# Patient Record
Sex: Female | Born: 1970 | Race: White | Hispanic: No | Smoking: Never smoker
Health system: Southern US, Community
[De-identification: ages and names within clinical notes are randomized; demographics above are authoritative.]

## PROBLEM LIST (undated history)

## (undated) DIAGNOSIS — F329 Major depressive disorder, single episode, unspecified: Secondary | ICD-10-CM

## (undated) DIAGNOSIS — D649 Anemia, unspecified: Secondary | ICD-10-CM

## (undated) DIAGNOSIS — T7840XA Allergy, unspecified, initial encounter: Secondary | ICD-10-CM

## (undated) DIAGNOSIS — R519 Headache, unspecified: Secondary | ICD-10-CM

## (undated) DIAGNOSIS — R87619 Unspecified abnormal cytological findings in specimens from cervix uteri: Secondary | ICD-10-CM

## (undated) DIAGNOSIS — F419 Anxiety disorder, unspecified: Secondary | ICD-10-CM

## (undated) DIAGNOSIS — J45909 Unspecified asthma, uncomplicated: Secondary | ICD-10-CM

## (undated) DIAGNOSIS — F410 Panic disorder [episodic paroxysmal anxiety] without agoraphobia: Secondary | ICD-10-CM

## (undated) DIAGNOSIS — F32A Depression, unspecified: Secondary | ICD-10-CM

## (undated) DIAGNOSIS — G4730 Sleep apnea, unspecified: Secondary | ICD-10-CM

## (undated) DIAGNOSIS — B977 Papillomavirus as the cause of diseases classified elsewhere: Secondary | ICD-10-CM

## (undated) DIAGNOSIS — R51 Headache: Secondary | ICD-10-CM

## (undated) DIAGNOSIS — R112 Nausea with vomiting, unspecified: Secondary | ICD-10-CM

## (undated) DIAGNOSIS — Z9889 Other specified postprocedural states: Secondary | ICD-10-CM

## (undated) HISTORY — DX: Allergy, unspecified, initial encounter: T78.40XA

## (undated) HISTORY — DX: Unspecified abnormal cytological findings in specimens from cervix uteri: R87.619

## (undated) HISTORY — DX: Major depressive disorder, single episode, unspecified: F32.9

## (undated) HISTORY — DX: Anxiety disorder, unspecified: F41.9

## (undated) HISTORY — PX: TONSILLECTOMY: SUR1361

## (undated) HISTORY — DX: Depression, unspecified: F32.A

## (undated) HISTORY — PX: TUBAL LIGATION: SHX77

## (undated) HISTORY — PX: EYE SURGERY: SHX253

## (undated) HISTORY — DX: Panic disorder (episodic paroxysmal anxiety): F41.0

## (undated) HISTORY — DX: Unspecified asthma, uncomplicated: J45.909

## (undated) HISTORY — PX: OTHER SURGICAL HISTORY: SHX169

## (undated) HISTORY — PX: KNEE SURGERY: SHX244

## (undated) HISTORY — DX: Papillomavirus as the cause of diseases classified elsewhere: B97.7

---

## 2002-12-25 ENCOUNTER — Encounter: Admission: RE | Admit: 2002-12-25 | Discharge: 2003-02-25 | Payer: Self-pay

## 2003-08-28 ENCOUNTER — Other Ambulatory Visit: Admission: RE | Admit: 2003-08-28 | Discharge: 2003-08-28 | Payer: Self-pay | Admitting: Obstetrics and Gynecology

## 2004-06-06 ENCOUNTER — Inpatient Hospital Stay (HOSPITAL_COMMUNITY): Admission: AD | Admit: 2004-06-06 | Discharge: 2004-06-08 | Payer: Self-pay | Admitting: Obstetrics and Gynecology

## 2004-07-12 ENCOUNTER — Encounter: Admission: RE | Admit: 2004-07-12 | Discharge: 2004-08-01 | Payer: Self-pay | Admitting: Obstetrics and Gynecology

## 2004-07-18 ENCOUNTER — Other Ambulatory Visit: Admission: RE | Admit: 2004-07-18 | Discharge: 2004-07-18 | Payer: Self-pay | Admitting: Obstetrics and Gynecology

## 2005-01-10 ENCOUNTER — Ambulatory Visit (HOSPITAL_COMMUNITY): Admission: RE | Admit: 2005-01-10 | Discharge: 2005-01-10 | Payer: Self-pay | Admitting: Obstetrics and Gynecology

## 2005-01-10 ENCOUNTER — Encounter (INDEPENDENT_AMBULATORY_CARE_PROVIDER_SITE_OTHER): Payer: Self-pay | Admitting: Specialist

## 2005-01-10 HISTORY — PX: OTHER SURGICAL HISTORY: SHX169

## 2005-10-26 ENCOUNTER — Other Ambulatory Visit: Admission: RE | Admit: 2005-10-26 | Discharge: 2005-10-26 | Payer: Self-pay | Admitting: Obstetrics and Gynecology

## 2007-02-25 ENCOUNTER — Inpatient Hospital Stay (HOSPITAL_COMMUNITY): Admission: AD | Admit: 2007-02-25 | Discharge: 2007-02-25 | Payer: Self-pay | Admitting: Obstetrics and Gynecology

## 2007-03-12 ENCOUNTER — Inpatient Hospital Stay (HOSPITAL_COMMUNITY): Admission: AD | Admit: 2007-03-12 | Discharge: 2007-03-15 | Payer: Self-pay | Admitting: Obstetrics and Gynecology

## 2009-09-10 ENCOUNTER — Ambulatory Visit (HOSPITAL_COMMUNITY): Admission: RE | Admit: 2009-09-10 | Discharge: 2009-09-10 | Payer: Self-pay | Admitting: Obstetrics and Gynecology

## 2009-09-10 HISTORY — PX: DILATION AND CURETTAGE OF UTERUS: SHX78

## 2009-09-10 HISTORY — PX: ABLATION: SHX5711

## 2009-11-16 ENCOUNTER — Emergency Department (HOSPITAL_COMMUNITY): Admission: EM | Admit: 2009-11-16 | Discharge: 2009-11-16 | Payer: Self-pay | Admitting: Emergency Medicine

## 2010-09-18 LAB — CBC
HCT: 36.5 % (ref 36.0–46.0)
Hemoglobin: 11.8 g/dL — ABNORMAL LOW (ref 12.0–15.0)
MCHC: 32.4 g/dL (ref 30.0–36.0)
MCV: 78.8 fL (ref 78.0–100.0)
RBC: 4.64 MIL/uL (ref 3.87–5.11)
RDW: 13.9 % (ref 11.5–15.5)

## 2010-11-11 NOTE — Op Note (Signed)
Christy Adams, Christy Adams              ACCOUNT NO.:  [id number]   MEDICAL RECORD NO.:  [id number]          PATIENT TYPE:  AMB   LOCATION:  SDC                           [facility]  [facility]   PHYSICIAN:  Huel Cote, M.D. DATE OF BIRTH:  04-Jul-1970   DATE OF PROCEDURE:  01/10/2005  DATE OF DISCHARGE:                                 OPERATIVE REPORT   PREOPERATIVE DIAGNOSIS:  1.  Vaginal lesion.  2.  Dyspareunia.   POSTOPERATIVE DIAGNOSIS:  1.  Vaginal lesion.  2.  Dyspareunia.  3.  With mole on right thigh as well.   PROCEDURE:  Excision of vaginal lesion from left fornix and mole excision.   SURGEON:  Dr. Huel Cote.   ANESTHESIA:  Spinal.   SPECIMEN:  Mole and vaginal lesion.   ESTIMATED BLOOD LOSS:  100 mL.   COMPLICATIONS:  None.   FINDINGS:  There was a vaginal lesion noted in the left fornix at 4 o'clock  on the edge of cervix. It was slightly nodular and white in appearance. It  was not obviously cystic. The patient had requested a mole removal from her  right thigh which lay just adjacent to the panty line and had grown some in  the past several months. This was performed per her request.   PROCEDURE:  The patient was taken to the operating room where spinal  anesthesia was obtained and found be adequate by testing. Local anesthetic  was injected around a small mole on the right hip area. This was then  removed in entirety in an elliptical incision and the remaining incision  closed with three sutures of interrupted fashion 3-0 Vicryl. The speculum  was then placed in the vagina and the cervix was identified and grasped with  the ring forceps and pulled to the patient's right. In the left fornix there  was then visible a small nodular lesion at approximately 4 o'clock on the  edge of the cervix. This was easily palpable on exam and was the source of  the patient's tenderness with intercourse and had been identified on  previous office exam with full sensation.  This small nodule was grasped with  an Allis clamp and the Mayo scissors utilized to excise just around and  under the nodule removing it in its entirety. The underlying vaginal mucosa  was then closed with a running fashion of 2-0 Vicryl and good hemostasis  noted. At the conclusion of the procedure there was no active bleeding  noted. The fornix was again palpated and there was no residual nodule felt.  Unclear whether this represents scar tissue, granulation tissue or some  other  pathology therefore it was sent to pathology. The speculum was then removed  from the patient's vagina as well as all instruments, sponges which were  counted and found to be correct and the patient was taken to the recovery  room in stable condition.       KR/MEDQ  D:  01/10/2005  T:  01/10/2005  Job:  [id number]

## 2010-11-11 NOTE — Discharge Summary (Signed)
NAMEAISLING, Adams              ACCOUNT NO.:  [id number]   MEDICAL RECORD NO.:  [id number]          PATIENT TYPE:  INP   LOCATION:  9119                          [facility]:  [facility]   PHYSICIAN:  Huel Cote, M.D. DATE OF BIRTH:  09-23-1970   DATE OF ADMISSION:  06/06/2004  DATE OF DISCHARGE:  06/08/2004                                 DISCHARGE SUMMARY   DISCHARGE DIAGNOSES:  1.  Term pregnancy at 40+ weeks.  2.  Status post normal spontaneous vaginal delivery.  3.  Positive group B strep status.   DISCHARGE MEDICATIONS:  1.  Motrin 600 mg p.o. q.6 h.  2.  Percocet 1-2 tablets p.o. q.4 h. p.r.n.   DISCHARGE FOLLOWUP:  The patient is to followup with my office in 6 weeks  for her routine postpartum exam.   HOSPITAL COURSE:  The patient is a 40 year old G2, P1-0-0-1 who was admitted  at 40+ weeks for induction of labor given favorable cervix and past her due  date.  Patient's prenatal care was uncomplicated except for positive group B  strep status which will be treated in labor and also history of depression  which was very stable on Zoloft throughout her pregnancy.   PAST OB HISTORY:  Significant for one spontaneous vaginal delivery in 2003  of an 8 pound 12 ounce infant.   PRENATAL LABORATORIES:  A positive, rubella immune, hepatitis B surface  antigen negative, HIV negative, RPR negative, GC negative, Chlamydia  negative, group B strep positive, triple screen normal, and one hour Glucola  normal.   She had no known drug allergies.   She is taking Zoloft 50 mg p.o. q.h.s. on admission.   PAST MEDICAL HISTORY:  Significant only for the extreme depression which was  stable on medications and mild mitral valve prolapse which did not require  antibiotic therapy.   PAST SURGICAL HISTORY:  Left knee arthroscopy in the top right groin.   PAST GYN HISTORY:  No abnormal Pap smears.   On admission she was afebrile with stable vital signs.  Fetal heart rate was  reactive.   Cardiac exam was regular rate and rhythm.  Lungs were clear.  Abdomen was soft and nontender.  On pelvic exam, cervix was 15% effaced, 1-2  cm and -2 station.  She had a rupture of membranes with clear fluid obtained  and was placed on penicillin for her group B strep positive status upon  arrival.  She progressed well throughout the day.  Reached complete dilation  and pushed with a normal spontaneous vaginal delivery of a vigorous female  infant over small second-degree lacerations.  Nuchal cord x 1 reduced over  head.  Apgars were 7 and 9.  Weight was 8 pounds 2 ounces.  Placenta was  delivered spontaneously with a very long cord noted.  A small second-degree  laceration was repaired with 2-0 Vicryl.  Cervix, rectum, and vagina were  all intact.  She was then admitted for routine postpartum care and did very  well and postpartum day #2 she was having no significant complaints.  Her  lochia was normal.  She was afebrile with normal vital signs and her fundus  firm.  She had a postpartum hemoglobin of 9.4, down from 11.2 and was felt  stable for discharge home.     Kath   KR/MEDQ  D:  06/08/2004  T:  06/08/2004  Job:  [id number]

## 2010-11-11 NOTE — H&P (Signed)
Christy Adams, Christy Adams              ACCOUNT NO.:  [id number]   MEDICAL RECORD NO.:  [id number]          PATIENT TYPE:  AMB   LOCATION:  SDC                           [facility]   PHYSICIAN:  Huel Cote, M.D. DATE OF BIRTH:  08-May-1971   DATE OF ADMISSION:  01/10/2005  DATE OF DISCHARGE:                                HISTORY & PHYSICAL   The patient is a 40 year old G2, P2 who comes in for excision of a vaginal  nodule which is located in the left fornix of the vagina. This was first  noted after her most recent vaginal delivery and is somewhat tender and  causing difficulty with painful intercourse. For this reason we have decided  to elect removal.   PAST OBSTETRICAL HISTORY:  Significant for two vaginal deliveries,  uneventful.   PAST MEDICAL HISTORY:  1.  Depression.  2.  Mitral valve prolapse which does not require antibiotic therapy.   PAST SURGICAL HISTORY:  Left knee arthroscopy.   PAST GYN HISTORY:  No abnormal Pap smears.   CURRENT MEDICATIONS:  Micronor, birth control pills.   ALLERGIES:  No known drug allergies.   PHYSICAL EXAMINATION:  VITAL SIGNS:  Weight is approximately 160 pounds.  Blood pressure 100/70.  CARDIAC EXAM:  Regular rate and rhythm.  LUNGS:  Clear.  ABDOMEN:  soft, nontender.  PELVIC EXAM:  Normal genitalia noted. Cervix has no lesions. There is a  lesion palpable in the left fornix which could possibly be a cyst versus  scar tissue. This is tender to palpation. Uterus is normal and adnexa have  no masses. The patient has a persistent vaginal cyst which was first noted  in January of 2006 and has not resolved. It is tender and is causing some  pain with intercourse. Therefore we have elected to attempt surgical  resection.   The patient was counseled of the risks and benefits of the procedure  including bleeding, infection, and possible scarring in the vagina. She  understands and desires to proceed with the procedure as  stated.       KR/MEDQ  D:  01/01/2005  T:  01/01/2005  Job:  [id number]

## 2010-11-11 NOTE — Discharge Summary (Signed)
Christy Adams, Christy Adams              ACCOUNT NO.:  [id number]   MEDICAL RECORD NO.:  [id number]          PATIENT TYPE:  INP   LOCATION:  9112                          [facility]   PHYSICIAN:  Huel Cote, M.D. DATE OF BIRTH:  [birth year]   DATE OF ADMISSION:  03/12/2007  DATE OF DISCHARGE:  03/15/2007                               DISCHARGE SUMMARY   DISCHARGE DIAGNOSES:  1. Term pregnancy at 39-6/7 weeks delivered.  2. Variable lie of fetus with induction of labor while vertex.  3. Status post normal spontaneous vaginal delivery.   DISCHARGE MEDICATIONS:  1. Motrin 600 mg p.o. every 6 hours p.r.n.  2. Percocet 1-2 tablets p.o.  every 4 hours p.r.n.   DISCHARGE FOLLOWUP:  The patient is to follow up in the office in 6  weeks for her routine postpartum exam.   HOSPITAL COURSE:  The patient is 40 year old G3, P 2-0-0-2 who was  admitted at 39-6/[redacted] weeks gestation to labor and delivery for induction  of labor given a variable lie of the fetus rotating from vertex to  breech to vertex over the last 2 weeks.  This patient had been counseled  and the desire was to proceed with induction of labor while the vertex  was presenting.  She had been examined in the office and was found to be  51 and -3, but vertex and though was not extremely favorable, wished to  try a Pitocin induction to avoid C-section.  Prenatal care had been  otherwise uneventful except for some depression which was stable on  Zoloft.   PRENATAL LABS ARE AS FOLLOWS:  A+ antibody negative, RPR nonreactive,  rubella immune, hepatitis B surface antigen negative, HIV negative, GC  negative, chlamydia negative, group B strep positive, 1-hour Glucola 116  and first trimester screen normal.   PAST OBSTETRICAL HISTORY:  She had two prior vaginal deliveries in 2003,  8 pounds, 12 ounces, in 2005, 8 pounds, 2 ounces.   PAST GYN HISTORY:  No abnormal Pap smears.   PAST MEDICAL HISTORY:  Mild mitral valve prolapse and a  mild history of  hypothyroidism.   PAST SURGICAL HISTORY:  In 1991 she had a meniscus tear of her left  knee.  In 1982 she had a tonsillectomy.   ALLERGIES:  NO KNOWN DRUG ALLERGIES.   MEDICATIONS:  Included Zoloft 50 mg p.o. daily.   On admission she was afebrile with stable vital signs.  Fetal heart rate  was reactive.  Cervix was long one and high and no presenting part was  palpable when the patient arrived.  Ultrasound confirmed that the baby  was in a transverse lie and the patient was instructed to ambulate for a  short time.  After this was accomplished the baby was rechecked and was  found to be vertex and therefore Pitocin was begun.  She was counseled  carefully as a possibility with this unstable lie that if the baby did  not remain vertex she might require C-section and she was agreeable to  this plan.  She stayed on Pitocin for much of the night and began  to be  uncomfortable after several hours.  She did have spontaneous rupture of  membranes approximately 12 hours into the induction.  At that point she  was 1 cm.  Vertex was applied and she received an epidural secondary to  her pain and fatigue.  Shortly thereafter she was reexamined and found  to be 50 and 3 and -2 station with the vertex definitely well applied.  There was small forebag which was ruptured with clear fluid noted.  She  had an IUPC and FSE place applied to help with Pitocin adjustment and  the Pitocin was held briefly while the baseline was slightly low at 115  and then restarted when the baseline improved back to 130.  She then  continued to labor throughout the day and reached complete dilation and  in the evening time pushed well and delivered a viable female infant  with Apgars of 08/09, weight was 8 pounds, 14 ounces.  Placenta  delivered intact.  She had a small second-degree laceration which was  repaired in the usual fashion with 3-0 Vicryl and estimated blood loss  500 mL.  She did well  postpartum.  The baby did have a possibility of  dislocation of the hips and therefore was to see the orthopaedic doctor  shortly after delivery.  By postpartum day #2 she felt well.  She was  having minimal pain.  She was afebrile with stable vital signs.  She was  taking her Motrin, Percocet without difficulty and was felt stable for  discharge home.      Huel Cote, M.D.  Electronically Signed     KR/MEDQ  D:  04/10/2007  T:  04/11/2007  Job:  [id number]

## 2011-04-06 LAB — CBC
HCT: 26.5 — ABNORMAL LOW
MCHC: 33.3
MCV: 71.7 — ABNORMAL LOW
MCV: 72 — ABNORMAL LOW
Platelets: 285
Platelets: 340
RDW: 14.2 — ABNORMAL HIGH
RDW: 14.7 — ABNORMAL HIGH
WBC: 10.5
WBC: 14.6 — ABNORMAL HIGH

## 2011-11-23 ENCOUNTER — Ambulatory Visit (INDEPENDENT_AMBULATORY_CARE_PROVIDER_SITE_OTHER): Payer: BC Managed Care – PPO | Admitting: Family Medicine

## 2011-11-23 VITALS — BP 109/70 | HR 64 | Temp 98.5°F | Resp 16 | Ht 66.75 in | Wt 204.4 lb

## 2011-11-23 DIAGNOSIS — L259 Unspecified contact dermatitis, unspecified cause: Secondary | ICD-10-CM

## 2011-11-23 DIAGNOSIS — F329 Major depressive disorder, single episode, unspecified: Secondary | ICD-10-CM

## 2011-11-23 DIAGNOSIS — F32A Depression, unspecified: Secondary | ICD-10-CM

## 2011-11-23 MED ORDER — METHYLPREDNISOLONE ACETATE 80 MG/ML IJ SUSP
80.0000 mg | Freq: Once | INTRAMUSCULAR | Status: AC
  Administered 2011-11-23: 80 mg via INTRAMUSCULAR

## 2011-11-23 MED ORDER — SERTRALINE HCL 100 MG PO TABS: 150.0000 mg | ORAL_TABLET | Freq: Every day | ORAL | Status: DC

## 2011-11-23 NOTE — Progress Notes (Signed)
  Subjective:    Patient ID: Christy Adams, female    DOB: October 19, 1970, 41 y.o.   MRN: [id number]  HPI 41 yo female here with rash on her left arm.  Intensely itchy.  Seems to be spreading up her arm.  Started while in [place] at [employer].  Doesn't remember being directly exposed to poison ivy but does have a dog.  Does not remember any prodrome of pain, burn, or itch prior to rash showing up.  Feels well otherwise. Has been using daughter's betamethasone cream -  Not helping.  Ice makes it feel better.   Also, needs zoloft refilled. Doing well on it.  Definitely notices when she forgets to take it.    Review of Systems Negative except as per HPI     Objective:   Physical Exam  Constitutional: She appears well-developed.  Pulmonary/Chest: Effort normal.  Neurological: She is alert.  Skin:       Erythematous (pink to salmon) patches with grouped vesicles along left forearm, elbow, upper arm.           Assessment & Plan:  RAsh - given color and intense itch and absence or prodrome favor poison ivy over shingles.  However, shingles also possible given distribution. Try depomedrol.  If no improvement, increases suspicion for shingles and advised patient to call to get Rx for acyclovir.   Depression - zoloft refilled for 6 months.

## 2012-01-17 ENCOUNTER — Telehealth: Payer: Self-pay

## 2012-01-17 MED ORDER — SERTRALINE HCL 100 MG PO TABS: 150.0000 mg | ORAL_TABLET | Freq: Every day | ORAL | Status: DC

## 2012-01-17 NOTE — Telephone Encounter (Signed)
Yes you can call in 1 months supply

## 2012-01-17 NOTE — Telephone Encounter (Signed)
rx sent into pharmacy and lmom notifying pt.

## 2012-01-17 NOTE — Telephone Encounter (Signed)
[place] with only on zoloft left and is needing to see if dr can call in a few until she returns Sunday to [facility] [phone].

## 2012-03-27 ENCOUNTER — Encounter: Payer: BC Managed Care – PPO | Admitting: Physician Assistant

## 2012-04-08 ENCOUNTER — Ambulatory Visit (INDEPENDENT_AMBULATORY_CARE_PROVIDER_SITE_OTHER): Payer: BC Managed Care – PPO | Admitting: Emergency Medicine

## 2012-04-08 VITALS — BP 118/64 | HR 86 | Temp 97.8°F | Resp 16 | Ht 66.5 in | Wt 197.0 lb

## 2012-04-08 DIAGNOSIS — F329 Major depressive disorder, single episode, unspecified: Secondary | ICD-10-CM

## 2012-04-08 DIAGNOSIS — F32A Depression, unspecified: Secondary | ICD-10-CM

## 2012-04-08 MED ORDER — SERTRALINE HCL 100 MG PO TABS: 150.0000 mg | ORAL_TABLET | Freq: Every day | ORAL | Status: DC

## 2012-04-08 NOTE — Progress Notes (Signed)
[facility] [address] [place] [address] [phone]  Date:  04/08/2012   Name:  Christy Adams   DOB:  Jan 12, 1971   MRN:  [id number]  PCP:  No primary provider on file.    Chief Complaint: Medication Refill   History of Present Illness:  Christy Adams is a 41 y.o. very pleasant female patient who presents with the following:  Long history of depression under treatment with zoloft and has run out of her medication.  She has no suicidal thoughts or thoughts of self harm or harm to others.    There is no problem list on file for this patient.   No past medical history on file.  No past surgical history on file.  History  Substance Use Topics  . Smoking status: Never Smoker   . Smokeless tobacco: Not on file  . Alcohol Use: Not on file    No family history on file.  No Known Allergies  Medication list has been reviewed and updated.  Current Outpatient Prescriptions on File Prior to Visit  Medication Sig Dispense Refill  . albuterol (PROVENTIL HFA;VENTOLIN HFA) 108 (90 BASE) MCG/ACT inhaler Inhale 2 puffs into the lungs every 6 (six) hours as needed.      . sertraline (ZOLOFT) 100 MG tablet Take 1.5 tablets (150 mg total) by mouth daily.  60 tablet  0    Review of Systems:  As per HPI, otherwise negative.    Physical Examination: Filed Vitals:   04/08/12 0842  BP: 118/64  Pulse: 86  Temp: 97.8 F (36.6 C)  Resp: 16   Filed Vitals:   04/08/12 0842  Height: 5' 6.5" (1.689 m)  Weight: 197 lb (89.359 kg)   Body mass index is 31.32 kg/(m^2). Ideal Body Weight: Weight in (lb) to have BMI = 25: 156.9    GEN: WDWN, NAD, Non-toxic, Alert & Oriented x 3 HEENT: Atraumatic, Normocephalic.  Ears and Nose: No external deformity. EXTR: No clubbing/cyanosis/edema NEURO: Normal gait.  PSYCH: Normally interactive. Conversant. Not depressed or anxious appearing.  Calm demeanor.    Assessment and Plan: Depression Refill  medication Meds ordered this encounter  Medications  . albuterol (PROVENTIL HFA;VENTOLIN HFA) 108 (90 BASE) MCG/ACT inhaler    Sig: Inhale 2 puffs into the lungs every 6 (six) hours as needed.  . sertraline (ZOLOFT) 100 MG tablet    Sig: Take 1.5 tablets (150 mg total) by mouth daily.    Dispense:  60 tablet    Refill:  5   I have reviewed and agree with documentation. Robert P. Merla Riches, M.D.  Carmelina Dane, MD

## 2012-04-09 DIAGNOSIS — F329 Major depressive disorder, single episode, unspecified: Secondary | ICD-10-CM | POA: Insufficient documentation

## 2012-04-09 DIAGNOSIS — F32A Depression, unspecified: Secondary | ICD-10-CM | POA: Insufficient documentation

## 2012-04-17 ENCOUNTER — Other Ambulatory Visit: Payer: Self-pay | Admitting: Family Medicine

## 2012-04-23 ENCOUNTER — Encounter: Payer: Self-pay | Admitting: Family Medicine

## 2012-04-23 ENCOUNTER — Ambulatory Visit (INDEPENDENT_AMBULATORY_CARE_PROVIDER_SITE_OTHER): Payer: BC Managed Care – PPO | Admitting: Family Medicine

## 2012-04-23 ENCOUNTER — Ambulatory Visit: Payer: BC Managed Care – PPO

## 2012-04-23 VITALS — BP 119/76 | HR 95 | Temp 98.8°F | Resp 22 | Ht 66.0 in | Wt 192.0 lb

## 2012-04-23 DIAGNOSIS — J45909 Unspecified asthma, uncomplicated: Secondary | ICD-10-CM

## 2012-04-23 DIAGNOSIS — J069 Acute upper respiratory infection, unspecified: Secondary | ICD-10-CM

## 2012-04-23 MED ORDER — AZITHROMYCIN 250 MG PO TABS: ORAL_TABLET | ORAL | Status: DC

## 2012-04-23 MED ORDER — ALBUTEROL SULFATE (2.5 MG/3ML) 0.083% IN NEBU
2.5000 mg | INHALATION_SOLUTION | Freq: Once | RESPIRATORY_TRACT | Status: AC
  Administered 2012-04-23: 2.5 mg via RESPIRATORY_TRACT

## 2012-04-23 MED ORDER — PREDNISONE 50 MG PO TABS: ORAL_TABLET | ORAL | Status: DC

## 2012-04-23 MED ORDER — IPRATROPIUM BROMIDE 0.02 % IN SOLN
0.5000 mg | Freq: Once | RESPIRATORY_TRACT | Status: AC
  Administered 2012-04-23: 0.5 mg via RESPIRATORY_TRACT

## 2012-04-23 MED ORDER — METHYLPREDNISOLONE SODIUM SUCC 125 MG IJ SOLR
125.0000 mg | Freq: Once | INTRAMUSCULAR | Status: AC
  Administered 2012-04-23: 125 mg via INTRAMUSCULAR

## 2012-04-23 NOTE — Progress Notes (Signed)
  Subjective:    Patient ID: Christy Adams, female    DOB: [birth year], 41 y.o.   MRN: [id number]  HPI URI Symptoms Onset: 10 days  Description: rhinorrhea, nasal congestion, cough, wheezing.  Modifying factors:  Baseline hx/o asthma. Feels like she is having a flare   Symptoms Nasal discharge: yes Fever: no Sore throat: no Cough: yes Wheezing: yes Ear pain: no GI symptoms: no Sick contacts: yes  Red Flags  Stiff neck: no Dyspnea: yes Rash: no Swallowing difficulty: no  Sinusitis Risk Factors Headache/face pain: no Double sickening: no tooth pain: no  Allergy Risk Factors Sneezing: no Itchy scratchy throat: no Seasonal symptoms: no  Flu Risk Factors Headache: no muscle aches: no severe fatigue: no     Review of Systems See HPI, otherwise 12 point ROS negative     Objective:   Physical Exam  Constitutional: She appears well-developed. She appears distressed.       + increased WOB Able to speak in near full sentences    HENT:  Head: Normocephalic and atraumatic.  Right Ear: External ear normal.  Left Ear: External ear normal.       +nasal erythema, rhinorrhea bilaterally, + post oropharyngeal erythema    Eyes: Conjunctivae normal are normal. Pupils are equal, round, and reactive to light.  Neck: Normal range of motion. Neck supple.  Cardiovascular: Normal rate, regular rhythm and normal heart sounds.   Pulmonary/Chest: Effort normal. She has wheezes.  Abdominal: Soft.  Musculoskeletal: Normal range of motion.  Lymphadenopathy:    She has no cervical adenopathy.  Neurological: She is alert.  Skin: Skin is warm.     [facility] reading (PRIMARY) by  Dr. Alvester Morin. CXR preliminarily negative for any focal infiltrate. Mild perihilar bronchitic changes       Assessment & Plan:  URI induced asthma exacerbation.  S/p duoneb and solumedrol 125 mg IM x1.  Overall improvement in sxs s/p treatment.  No noted focal infiltrate on CXR.  Will place on course of  prednisone.  Zpak for atypical coverage.  Discussed infectious red flags.  Follow up as needed.     The patient and/or caregiver has been counseled thoroughly with regard to treatment plan and/or medications prescribed including dosage, schedule, interactions, rationale for use, and possible side effects and they verbalize understanding. Diagnoses and expected course of recovery discussed and will return if not improved as expected or if the condition worsens. Patient and/or caregiver verbalized understanding.

## 2012-07-29 ENCOUNTER — Ambulatory Visit (INDEPENDENT_AMBULATORY_CARE_PROVIDER_SITE_OTHER): Payer: BC Managed Care – PPO | Admitting: Emergency Medicine

## 2012-07-29 VITALS — BP 122/80 | HR 80 | Temp 98.7°F | Resp 18 | Ht 66.0 in | Wt 194.0 lb

## 2012-07-29 DIAGNOSIS — J018 Other acute sinusitis: Secondary | ICD-10-CM

## 2012-07-29 DIAGNOSIS — J209 Acute bronchitis, unspecified: Secondary | ICD-10-CM

## 2012-07-29 MED ORDER — AMOXICILLIN-POT CLAVULANATE 875-125 MG PO TABS: 1.0000 | ORAL_TABLET | Freq: Two times a day (BID) | ORAL | Status: DC

## 2012-07-29 MED ORDER — HYDROCOD POLST-CHLORPHEN POLST 10-8 MG/5ML PO LQCR: 5.0000 mL | Freq: Two times a day (BID) | ORAL | Status: DC | PRN

## 2012-07-29 MED ORDER — PSEUDOEPHEDRINE-GUAIFENESIN ER 60-600 MG PO TB12: 1.0000 | ORAL_TABLET | Freq: Two times a day (BID) | ORAL | Status: DC

## 2012-07-29 NOTE — Patient Instructions (Addendum)

## 2012-07-29 NOTE — Progress Notes (Signed)
[facility] [address] [place] [address] [phone]  Date:  07/29/2012   Name:  Christy Adams   DOB:  1970-09-04   MRN:  [id number]  PCP:  No primary provider on file.    Chief Complaint: Nasal Congestion, Asthma and Cough   History of Present Illness:  Christy Adams is a 42 y.o. very pleasant female patient who presents with the following:  Ill for 2 weeks with green nasal drainage, post nasal drip, sore throat, cough productive green sputum.  No fever or chills. Hoarse.  No nausea or vomiting.  No stool change.  Some wheezing but no shortness of breath.  No improvement with OTC medications.  Patient Active Problem List  Diagnosis  . Depressed    Past Medical History  Diagnosis Date  . Allergy   . Asthma   . Anxiety   . Depression   . Panic attacks     Past Surgical History  Procedure Date  . Knee surgery   . Tonsillectomy   . Wisedom teeth   . Uterius      oblation    History  Substance Use Topics  . Smoking status: Never Smoker   . Smokeless tobacco: Not on file  . Alcohol Use: Not on file    Family History  Problem Relation Age of Onset  . Heart disease Mother   . Depression Mother   . Hypertension Mother   . Heart failure Maternal Grandmother   . Diabetes Maternal Grandfather   . Heart attack Maternal Grandfather     No Known Allergies  Medication list has been reviewed and updated.  Current Outpatient Prescriptions on File Prior to Visit  Medication Sig Dispense Refill  . albuterol (PROVENTIL HFA;VENTOLIN HFA) 108 (90 BASE) MCG/ACT inhaler Inhale 2 puffs into the lungs every 6 (six) hours as needed.      . sertraline (ZOLOFT) 100 MG tablet Take 1.5 tablets (150 mg total) by mouth daily.  60 tablet  5  . azithromycin (ZITHROMAX) 250 MG tablet Take 2 tabs PO x 1 dose, then 1 tab PO QD x 4 days  6 tablet  0  . predniSONE (DELTASONE) 50 MG tablet 1 tab daily x 5 days  5 tablet  0    Review of Systems:  As per  HPI, otherwise negative.    Physical Examination: Filed Vitals:   07/29/12 1435  BP: 122/80  Pulse: 80  Temp: 98.7 F (37.1 C)  Resp: 18   Filed Vitals:   07/29/12 1435  Height: 5\' 6"  (1.676 m)  Weight: 194 lb (87.998 kg)   Body mass index is 31.31 kg/(m^2). Ideal Body Weight: Weight in (lb) to have BMI = 25: 154.6   GEN: WDWN, NAD, Non-toxic, A & O x 3 HEENT: Atraumatic, Normocephalic. Neck supple. No masses, No LAD. Ears and Nose: No external deformity. CV: RRR, No M/G/R. No JVD. No thrill. No extra heart sounds. PULM: CTA B,  Diffuse wheezes, no crackles, rhonchi. No retractions. No resp. distress. No accessory muscle use. ABD: S, NT, ND, +BS. No rebound. No HSM. EXTR: No c/c/e NEURO Normal gait.  PSYCH: Normally interactive. Conversant. Not depressed or anxious appearing.  Calm demeanor.    Assessment and Plan: Sinusitis Bronchitis with bronchospasm augmentin mucinex d tussionex Use MDI  Carmelina Dane, MD

## 2013-03-23 ENCOUNTER — Ambulatory Visit (INDEPENDENT_AMBULATORY_CARE_PROVIDER_SITE_OTHER): Payer: BC Managed Care – PPO | Admitting: Internal Medicine

## 2013-03-23 VITALS — BP 114/68 | HR 66 | Temp 99.3°F | Resp 16 | Ht 66.25 in | Wt 210.0 lb

## 2013-03-23 DIAGNOSIS — B300 Keratoconjunctivitis due to adenovirus: Secondary | ICD-10-CM

## 2013-03-23 DIAGNOSIS — H16209 Unspecified keratoconjunctivitis, unspecified eye: Secondary | ICD-10-CM

## 2013-03-23 MED ORDER — OFLOXACIN 0.3 % OP SOLN: 2.0000 [drp] | Freq: Four times a day (QID) | OPHTHALMIC | Status: DC

## 2013-03-23 NOTE — Patient Instructions (Addendum)
Naphcon or vasocon hourly for comfort

## 2013-03-23 NOTE — Progress Notes (Signed)
  Subjective:    Patient ID: Christy Adams, female    DOB: [birth year], 42 y.o.   MRN: [id number]  HPI in both eyes are swollen with redness photophobia and discharge for 3-4 days Feels fatigued Daughter with pink eye Preceding No cough or sore throat Was using child's drops-polymyxin and  Sulfa-slight improvement but relapsed last night    Review of Systems Negative    Objective:   Physical Exam BP 114/68  Pulse 66  Temp(Src) 99.3 F (37.4 C) (Oral)  Resp 16  Ht 5' 6.25" (1.683 m)  Wt 210 lb (95.255 kg)  BMI 33.63 kg/m2  SpO2 97% In both eyes injected/with slightly swollen/purulent discharge bilaterally Pupils equal round reactive to light and accommodation Vision intact  No regional adenopathy Ears nose and throat clear     Assessment & Plan:  Keratoconjunctivitis due to Adenovirus  Meds ordered this encounter  Medications  . ofloxacin (OCUFLOX) 0.3 % ophthalmic solution    Sig: Place 2 drops into both eyes 4 (four) times daily.    Dispense:  5 mL    Refill:  1   vasocon hourly for pain relief Ck 5 d if not better

## 2013-04-15 ENCOUNTER — Other Ambulatory Visit: Payer: Self-pay | Admitting: Emergency Medicine

## 2013-05-23 ENCOUNTER — Other Ambulatory Visit: Payer: Self-pay | Admitting: Emergency Medicine

## 2014-09-10 NOTE — Patient Instructions (Addendum)
   Your procedure is scheduled on:  Monday, March 21  Enter through the Main Entrance of [facility] at: 6 AM Pick up the phone at the desk and dial [phone] and inform us of your arrival.  Please call this number if you have any problems the morning of surgery: [phone]  Remember: Do not eat or drink after midnight: Sunday Take these medicines the morning of surgery with a SIP OF WATER:  zoloft  Do not wear jewelry, make-up, or FINGER nail polish No metal in your hair or on your body. Do not wear lotions, powders, perfumes.  You may wear deodorant.  Do not bring valuables to the hospital. Contacts, dentures or bridgework may not be worn into surgery.    Patients discharged on the day of surgery will not be allowed to drive home.  Home with friend Ola cell [phone]

## 2014-09-11 ENCOUNTER — Encounter (HOSPITAL_COMMUNITY)
Admission: RE | Admit: 2014-09-11 | Discharge: 2014-09-11 | Disposition: A | Discharge status: Home / Self Care | Payer: BLUE CROSS/BLUE SHIELD | Source: Ambulatory Visit | Attending: Obstetrics and Gynecology | Admitting: Obstetrics and Gynecology

## 2014-09-11 ENCOUNTER — Encounter (HOSPITAL_COMMUNITY): Payer: Self-pay

## 2014-09-11 DIAGNOSIS — F410 Panic disorder [episodic paroxysmal anxiety] without agoraphobia: Secondary | ICD-10-CM | POA: Diagnosis not present

## 2014-09-11 DIAGNOSIS — J45909 Unspecified asthma, uncomplicated: Secondary | ICD-10-CM | POA: Diagnosis not present

## 2014-09-11 DIAGNOSIS — Z302 Encounter for sterilization: Secondary | ICD-10-CM | POA: Diagnosis present

## 2014-09-11 DIAGNOSIS — F1099 Alcohol use, unspecified with unspecified alcohol-induced disorder: Secondary | ICD-10-CM | POA: Diagnosis not present

## 2014-09-11 DIAGNOSIS — F329 Major depressive disorder, single episode, unspecified: Secondary | ICD-10-CM | POA: Diagnosis not present

## 2014-09-11 HISTORY — DX: Headache, unspecified: R51.9

## 2014-09-11 HISTORY — DX: Headache: R51

## 2014-09-11 HISTORY — DX: Anemia, unspecified: D64.9

## 2014-09-11 LAB — CBC
HCT: 39 % (ref 36.0–46.0)
Hemoglobin: 12.7 g/dL (ref 12.0–15.0)
MCH: 25.7 pg — ABNORMAL LOW (ref 26.0–34.0)
MCHC: 32.6 g/dL (ref 30.0–36.0)
MCV: 78.8 fL (ref 78.0–100.0)
Platelets: 293 10*3/uL (ref 150–400)
RBC: 4.95 MIL/uL (ref 3.87–5.11)
RDW: 14.6 % (ref 11.5–15.5)
WBC: 7.4 10*3/uL (ref 4.0–10.5)

## 2014-09-12 NOTE — H&P (Signed)
Christy Adams is an 44 y.o. female G3P3 presents for scheduled laparoscopic fulguration given a desire for permanent sterility.  She is s/p ablation procedure and has very light menses if any.  She is aware that the ablation is not a form of birth control.  Recently divorced from her husband who had a vasectomy and in a new relationship.  She is healthy other than mild asthma and anxiety/depression.  Pertinent Gynecological History: OB History: NSVD x3   Menstrual History:  No LMP recorded. Patient has had an ablation.    Past Medical History  Diagnosis Date  . Allergy   . Anxiety   . Depression   . Panic attacks   . Anemia   . SVD (spontaneous vaginal delivery)     x 3  . Asthma     allergic to dust - only uses inhaler prn - rarely  . Headache     otc med prn - rare    Past Surgical History  Procedure Laterality Date  . Knee surgery      left  . Tonsillectomy    . Wisedom teeth    . Ablation  09/10/09  . Dilation and curettage of uterus  09/10/09    hysteroscopy with Novasure  . Excision of vaginal mole  01/10/2005    Family History  Problem Relation Age of Onset  . Heart disease Mother   . Depression Mother   . Hypertension Mother   . Heart failure Maternal Grandmother   . Diabetes Maternal Grandfather   . Heart attack Maternal Grandfather     Social History:  reports that she has never smoked. She has never used smokeless tobacco. She reports that she drinks alcohol. She reports that she does not use illicit drugs.  Allergies: No Known Allergies  No prescriptions prior to admission    ROS  There were no vitals taken for this visit. Physical Exam  Constitutional: She appears well-developed and well-nourished.  Cardiovascular: Normal rate and regular rhythm.   Respiratory: Effort normal and breath sounds normal.  GI: Soft.  Genitourinary: Vagina normal and uterus normal.  Neurological: She is alert.  Psychiatric: She has a normal mood and affect.     No results found for this or any previous visit (from the past 24 hour(s)).  No results found.  Assessment/Plan: The risks and benefits of tubal fulguration were discussed with the patient in detail, including bleeding, infection and possible damage to surrounding organs.  We discussed if complications arose she might need a larger incision which would delay recovery.  We also discussed a risk of failure of 1/100 and increased risk of ectopic should pregnancy occur.  She realizes this is permanent surgery and has no desire for future childbearing.  I advised her to abstain 2 weeks prior to surgery or use barrier contraception to ensure not pregnant at time of surgery.  Logan Bores 09/12/2014, 4:35 PM

## 2014-09-14 ENCOUNTER — Encounter (HOSPITAL_COMMUNITY)
Admission: RE | Disposition: A | Discharge status: Home / Self Care | Payer: Self-pay | Source: Ambulatory Visit | Attending: Obstetrics and Gynecology

## 2014-09-14 ENCOUNTER — Ambulatory Visit (HOSPITAL_COMMUNITY)
Admission: RE | Admit: 2014-09-14 | Discharge: 2014-09-14 | Disposition: A | Discharge status: Home / Self Care | Payer: BLUE CROSS/BLUE SHIELD | Source: Ambulatory Visit | Attending: Obstetrics and Gynecology | Admitting: Obstetrics and Gynecology

## 2014-09-14 ENCOUNTER — Ambulatory Visit (HOSPITAL_COMMUNITY): Payer: BLUE CROSS/BLUE SHIELD | Admitting: Anesthesiology

## 2014-09-14 DIAGNOSIS — F329 Major depressive disorder, single episode, unspecified: Secondary | ICD-10-CM | POA: Insufficient documentation

## 2014-09-14 DIAGNOSIS — J45909 Unspecified asthma, uncomplicated: Secondary | ICD-10-CM | POA: Insufficient documentation

## 2014-09-14 DIAGNOSIS — F410 Panic disorder [episodic paroxysmal anxiety] without agoraphobia: Secondary | ICD-10-CM | POA: Insufficient documentation

## 2014-09-14 DIAGNOSIS — Z302 Encounter for sterilization: Secondary | ICD-10-CM | POA: Diagnosis not present

## 2014-09-14 DIAGNOSIS — F1099 Alcohol use, unspecified with unspecified alcohol-induced disorder: Secondary | ICD-10-CM | POA: Insufficient documentation

## 2014-09-14 HISTORY — PX: LAPAROSCOPIC TUBAL LIGATION: SHX1937

## 2014-09-14 LAB — PREGNANCY, URINE: Preg Test, Ur: NEGATIVE

## 2014-09-14 SURGERY — LIGATION, FALLOPIAN TUBE, LAPAROSCOPIC
Anesthesia: General | Laterality: Bilateral

## 2014-09-14 MED ORDER — SCOPOLAMINE 1 MG/3DAYS TD PT72
1.0000 | MEDICATED_PATCH | Freq: Once | TRANSDERMAL | Status: DC
  Administered 2014-09-14: 1.5 mg via TRANSDERMAL

## 2014-09-14 MED ORDER — DEXAMETHASONE SODIUM PHOSPHATE 10 MG/ML IJ SOLN
INTRAMUSCULAR | Status: AC
  Filled 2014-09-14: qty 1

## 2014-09-14 MED ORDER — MIDAZOLAM HCL 2 MG/2ML IJ SOLN
INTRAMUSCULAR | Status: DC | PRN
  Administered 2014-09-14: 2 mg via INTRAVENOUS

## 2014-09-14 MED ORDER — LIDOCAINE HCL (CARDIAC) 20 MG/ML IV SOLN
INTRAVENOUS | Status: DC | PRN
  Administered 2014-09-14: 60 mg via INTRAVENOUS

## 2014-09-14 MED ORDER — ROCURONIUM BROMIDE 100 MG/10ML IV SOLN
INTRAVENOUS | Status: AC
Start: 2014-09-14 — End: 2014-09-14
  Filled 2014-09-14: qty 1

## 2014-09-14 MED ORDER — ATROPINE SULFATE 0.4 MG/ML IJ SOLN
INTRAMUSCULAR | Status: DC | PRN
  Administered 2014-09-14: .2 mg via INTRAVENOUS

## 2014-09-14 MED ORDER — MIDAZOLAM HCL 2 MG/2ML IJ SOLN
INTRAMUSCULAR | Status: AC
  Filled 2014-09-14: qty 2

## 2014-09-14 MED ORDER — NEOSTIGMINE METHYLSULFATE 10 MG/10ML IV SOLN
INTRAVENOUS | Status: DC | PRN
  Administered 2014-09-14: 4 mg via INTRAVENOUS

## 2014-09-14 MED ORDER — ONDANSETRON HCL 4 MG/2ML IJ SOLN
INTRAMUSCULAR | Status: DC | PRN
  Administered 2014-09-14: 4 mg via INTRAVENOUS

## 2014-09-14 MED ORDER — GLYCOPYRROLATE 0.2 MG/ML IJ SOLN
INTRAMUSCULAR | Status: AC
  Filled 2014-09-14: qty 4

## 2014-09-14 MED ORDER — BUPIVACAINE HCL (PF) 0.25 % IJ SOLN
INTRAMUSCULAR | Status: DC | PRN
  Administered 2014-09-14: 8 mL

## 2014-09-14 MED ORDER — FENTANYL CITRATE 0.05 MG/ML IJ SOLN
INTRAMUSCULAR | Status: AC
  Filled 2014-09-14: qty 5

## 2014-09-14 MED ORDER — SILVER NITRATE-POT NITRATE 75-25 % EX MISC
CUTANEOUS | Status: AC
  Filled 2014-09-14: qty 1

## 2014-09-14 MED ORDER — LACTATED RINGERS IV SOLN
INTRAVENOUS | Status: DC
  Administered 2014-09-14 (×2): via INTRAVENOUS

## 2014-09-14 MED ORDER — ATROPINE SULFATE 0.4 MG/ML IJ SOLN
INTRAMUSCULAR | Status: AC
  Filled 2014-09-14: qty 1

## 2014-09-14 MED ORDER — SCOPOLAMINE 1 MG/3DAYS TD PT72
MEDICATED_PATCH | TRANSDERMAL | Status: AC
  Administered 2014-09-14: 1.5 mg via TRANSDERMAL
  Filled 2014-09-14: qty 1

## 2014-09-14 MED ORDER — MEPERIDINE HCL 25 MG/ML IJ SOLN: 6.2500 mg | INTRAMUSCULAR | Status: DC | PRN

## 2014-09-14 MED ORDER — FENTANYL CITRATE 0.05 MG/ML IJ SOLN
INTRAMUSCULAR | Status: DC | PRN
  Administered 2014-09-14: 100 ug via INTRAVENOUS
  Administered 2014-09-14: 50 ug via INTRAVENOUS
  Administered 2014-09-14: 100 ug via INTRAVENOUS

## 2014-09-14 MED ORDER — GLYCOPYRROLATE 0.2 MG/ML IJ SOLN
INTRAMUSCULAR | Status: DC | PRN
  Administered 2014-09-14: .8 mg via INTRAVENOUS

## 2014-09-14 MED ORDER — BUPIVACAINE HCL (PF) 0.25 % IJ SOLN
INTRAMUSCULAR | Status: AC
  Filled 2014-09-14: qty 30

## 2014-09-14 MED ORDER — SODIUM CHLORIDE 0.9 % IJ SOLN
INTRAMUSCULAR | Status: AC
  Filled 2014-09-14: qty 10

## 2014-09-14 MED ORDER — SODIUM CHLORIDE 0.9 % IJ SOLN
INTRAMUSCULAR | Status: DC | PRN
  Administered 2014-09-14: 10 mL

## 2014-09-14 MED ORDER — ROCURONIUM BROMIDE 100 MG/10ML IV SOLN
INTRAVENOUS | Status: DC | PRN
  Administered 2014-09-14: 35 mg via INTRAVENOUS

## 2014-09-14 MED ORDER — ONDANSETRON HCL 4 MG/2ML IJ SOLN
INTRAMUSCULAR | Status: AC
  Filled 2014-09-14: qty 2

## 2014-09-14 MED ORDER — PROMETHAZINE HCL 25 MG/ML IJ SOLN: 6.2500 mg | INTRAMUSCULAR | Status: DC | PRN

## 2014-09-14 MED ORDER — HYDROMORPHONE HCL 1 MG/ML IJ SOLN: 0.2500 mg | INTRAMUSCULAR | Status: DC | PRN

## 2014-09-14 MED ORDER — DEXAMETHASONE SODIUM PHOSPHATE 4 MG/ML IJ SOLN
INTRAMUSCULAR | Status: DC | PRN
  Administered 2014-09-14: 4 mg via INTRAVENOUS

## 2014-09-14 MED ORDER — IBUPROFEN 600 MG PO TABS: 600.0000 mg | ORAL_TABLET | Freq: Four times a day (QID) | ORAL | Status: DC | PRN

## 2014-09-14 MED ORDER — LIDOCAINE HCL (CARDIAC) 20 MG/ML IV SOLN
INTRAVENOUS | Status: AC
  Filled 2014-09-14: qty 5

## 2014-09-14 MED ORDER — KETOROLAC TROMETHAMINE 30 MG/ML IJ SOLN
INTRAMUSCULAR | Status: AC
  Filled 2014-09-14: qty 1

## 2014-09-14 MED ORDER — KETOROLAC TROMETHAMINE 30 MG/ML IJ SOLN
INTRAMUSCULAR | Status: DC | PRN
  Administered 2014-09-14: 30 mg via INTRAVENOUS

## 2014-09-14 MED ORDER — NEOSTIGMINE METHYLSULFATE 10 MG/10ML IV SOLN
INTRAVENOUS | Status: AC
  Filled 2014-09-14: qty 1

## 2014-09-14 MED ORDER — PROPOFOL 10 MG/ML IV BOLUS
INTRAVENOUS | Status: DC | PRN
  Administered 2014-09-14: 200 mg via INTRAVENOUS

## 2014-09-14 MED ORDER — PROPOFOL 10 MG/ML IV BOLUS
INTRAVENOUS | Status: AC
  Filled 2014-09-14: qty 20

## 2014-09-14 MED ORDER — OXYCODONE-ACETAMINOPHEN 5-325 MG PO TABS: 1.0000 | ORAL_TABLET | ORAL | Status: DC | PRN

## 2014-09-14 SURGICAL SUPPLY — 19 items
CATH ROBINSON RED A/P 16FR (CATHETERS) ×2 IMPLANT
CHLORAPREP W/TINT 26ML (MISCELLANEOUS) ×2 IMPLANT
CLOTH BEACON ORANGE TIMEOUT ST (SAFETY) ×2 IMPLANT
DRSG COVADERM PLUS 2X2 (GAUZE/BANDAGES/DRESSINGS) ×4 IMPLANT
DRSG OPSITE POSTOP 3X4 (GAUZE/BANDAGES/DRESSINGS) ×1 IMPLANT
GLOVE BIO SURGEON STRL SZ 6.5 (GLOVE) ×2 IMPLANT
GOWN STRL REUS W/TWL LRG LVL3 (GOWN DISPOSABLE) ×4 IMPLANT
LIQUID BAND (GAUZE/BANDAGES/DRESSINGS) ×2 IMPLANT
NEEDLE INSUFFLATION 120MM (ENDOMECHANICALS) ×2 IMPLANT
PACK LAPAROSCOPY BASIN (CUSTOM PROCEDURE TRAY) ×2 IMPLANT
PAD POSITIONER PINK NONSTERILE (MISCELLANEOUS) ×2 IMPLANT
SUT VIC AB 3-0 PS2 18 (SUTURE)
SUT VIC AB 3-0 PS2 18XBRD (SUTURE) IMPLANT
SUT VICRYL 0 UR6 27IN ABS (SUTURE) ×2 IMPLANT
TOWEL OR 17X24 6PK STRL BLUE (TOWEL DISPOSABLE) ×4 IMPLANT
TROCAR XCEL NON-BLD 11X100MML (ENDOMECHANICALS) ×2 IMPLANT
TROCAR XCEL NON-BLD 5MMX100MML (ENDOMECHANICALS) IMPLANT
WARMER LAPAROSCOPE (MISCELLANEOUS) ×2 IMPLANT
WATER STERILE IRR 1000ML POUR (IV SOLUTION) ×2 IMPLANT

## 2014-09-14 NOTE — Anesthesia Preprocedure Evaluation (Signed)
Anesthesia Evaluation  Patient identified by MRN, date of birth, ID band Patient awake    Reviewed: Allergy & Precautions, H&P , NPO status , Patient's Chart, lab work & pertinent test results  Airway Mallampati: II  TM Distance: >3 FB Neck ROM: full    Dental no notable dental hx. (+) Teeth Intact   Pulmonary    Pulmonary exam normal       Cardiovascular negative cardio ROS      Neuro/Psych    GI/Hepatic negative GI ROS, Neg liver ROS,   Endo/Other  negative endocrine ROS  Renal/GU negative Renal ROS     Musculoskeletal   Abdominal Normal abdominal exam  (+)   Peds  Hematology   Anesthesia Other Findings   Reproductive/Obstetrics negative OB ROS                             Anesthesia Physical Anesthesia Plan  ASA: II  Anesthesia Plan: General   Post-op Pain Management:    Induction: Intravenous  Airway Management Planned: Oral ETT  Additional Equipment:   Intra-op Plan:   Post-operative Plan: Extubation in OR  Informed Consent: I have reviewed the patients History and Physical, chart, labs and discussed the procedure including the risks, benefits and alternatives for the proposed anesthesia with the patient or authorized representative who has indicated his/her understanding and acceptance.   Dental Advisory Given  Plan Discussed with: CRNA and Surgeon  Anesthesia Plan Comments:         Anesthesia Quick Evaluation

## 2014-09-14 NOTE — Progress Notes (Signed)
Patient ID: Christy Adams, female   DOB: 06/01/1971, 44 y.o.   MRN: [id number] Per py no changes in dictated H&P.  Brief exam WNL.  Ready to proceed.

## 2014-09-14 NOTE — Op Note (Signed)
Operative Note  Preoperative Diagnosis Desires sterility  Postoperative DIagnosis Same  Procedure Laparoscopic tubal fulguration  Surgeon Paula Compton  Anesthesia General  Fluids EBL  <50cc IVF 1200cc LR UOP 50cc  Findings Normal uterus, tubes and ovaries.  Normal appendix and liver edge   Procedure Note  Patient was taken to the operating room where general anesthesia was obtained without difficulty. She was then prepped and draped in the normal sterile fashion in the dorsal lithotomy position. An appropriate timeout was performed. A speculum was then placed within the vagina and an acorn tenaculum placed within the cervix for uterine manipulation. The bladder was emptied. Attention was then turned to the patient's abdomen after draping where the infraumbilical area was injected with approximately 8 cc of quarter percent Marcaine. A 1 cm incision was then made within the umbilicus and the varies needle easily introduced into the peritoneal cavity. Intraperitoneal placement was confirmed by aspiration and injection with normal saline. Gas flow was then applied and a pneumoperitoneum obtained with approximate 3 L of CO2 gas. The varies needle was then removed and the 11 mm trocar was easily introduced into the abdomen. With patient in Trendelenburg the uterus and tubes and ovaries were inspected with findings as previously stated. A bipolar cautery was then introduced through the operative scope and the left fallopian tube grasped approximately 3 cm from the uterine cornua and cauterized in 2 sequential areas with good blanching noted. Attention was then turned to the right fallopian tube which was likewise grasped proximally 3 cm from the uterine cornua and cauterized into sequential spots with good blanching noted there as well. The remainder of the pelvis and abdomen were inspected and found to be normal. The instruments were removed from the abdomen and the pneumoperitoneum reduced  through the trocar. The trocar was finally removed and the infraumbilical incision closed with one deep stitch of 0 Vicryl and a subcuticular stitch of 3-0 Vicryl. Dermabond and a bandage were placed. Patient was then awakened and taken to the recovery room in good condition.

## 2014-09-14 NOTE — Anesthesia Postprocedure Evaluation (Signed)
Anesthesia Post Note  Patient: Christy Adams  Procedure(s) Performed: Procedure(s) (LRB): LAPAROSCOPIC TUBAL LIGATION (Bilateral)  Anesthesia type: General  Patient location: PACU  Post pain: Pain level controlled  Post assessment: Post-op Vital signs reviewed  Last Vitals:  Filed Vitals:   09/14/14 0900  BP: 114/60  Pulse: 61  Temp:   Resp: 27    Post vital signs: Reviewed  Level of consciousness: sedated  Complications: No apparent anesthesia complications

## 2014-09-14 NOTE — Anesthesia Procedure Notes (Signed)
Procedure Name: Intubation Date/Time: 09/14/2014 7:30 AM Performed by: Flossie Dibble Pre-anesthesia Checklist: Patient identified, Emergency Drugs available, Suction available, Patient being monitored and Timeout performed Patient Re-evaluated:Patient Re-evaluated prior to inductionOxygen Delivery Method: Circle system utilized Preoxygenation: Pre-oxygenation with 100% oxygen Intubation Type: IV induction Ventilation: Mask ventilation without difficulty Laryngoscope Size: Mac and 3 Grade View: Grade III Tube type: Oral Tube size: 7.0 mm Number of attempts: 1 (DL by CRNA and unable to view cords. Intubated by Dr. Glennon Mac who had grade 3 view.) Airway Equipment and Method: Stylet Placement Confirmation: ETT inserted through vocal cords under direct vision Secured at: 20 cm Tube secured with: Tape Dental Injury: Teeth and Oropharynx as per pre-operative assessment

## 2014-09-14 NOTE — Transfer of Care (Signed)
Immediate Anesthesia Transfer of Care Note  Patient: Christy Adams  Procedure(s) Performed: Procedure(s): LAPAROSCOPIC TUBAL LIGATION (Bilateral)  Patient Location: PACU  Anesthesia Type:General  Level of Consciousness: awake, alert  and oriented  Airway & Oxygen Therapy: Patient Spontanous Breathing and Patient connected to nasal cannula oxygen  Post-op Assessment: Report given to RN and Post -op Vital signs reviewed and stable  Post vital signs: Reviewed and stable  Last Vitals:  Filed Vitals:   09/14/14 0616  BP: 112/53  Pulse: 66  Temp: 36.8 C  Resp: 18    Complications: No apparent anesthesia complications

## 2014-09-15 ENCOUNTER — Encounter (HOSPITAL_COMMUNITY): Payer: Self-pay | Admitting: Obstetrics and Gynecology

## 2016-02-14 ENCOUNTER — Ambulatory Visit (INDEPENDENT_AMBULATORY_CARE_PROVIDER_SITE_OTHER): Payer: 59 | Admitting: Physician Assistant

## 2016-02-14 VITALS — BP 122/78 | HR 60 | Temp 98.0°F | Resp 17 | Ht 66.0 in | Wt 192.0 lb

## 2016-02-14 DIAGNOSIS — T7840XA Allergy, unspecified, initial encounter: Secondary | ICD-10-CM | POA: Diagnosis not present

## 2016-02-14 MED ORDER — GUAIFENESIN ER 1200 MG PO TB12: 1.0000 | ORAL_TABLET | Freq: Two times a day (BID) | ORAL | 1 refills | Status: DC | PRN

## 2016-02-14 MED ORDER — CETIRIZINE HCL 10 MG PO TABS: 10.0000 mg | ORAL_TABLET | Freq: Every day | ORAL | 2 refills | Status: DC

## 2016-02-14 MED ORDER — FLUTICASONE PROPIONATE 50 MCG/ACT NA SUSP: 2.0000 | Freq: Every day | NASAL | 2 refills | Status: DC

## 2016-02-14 NOTE — Progress Notes (Signed)
By signing my name below, I, Mesha Guinyard, attest that this documentation has been prepared under the direction and in the presence of Treatment Team:  Attending Provider: Wardell Honour, MD Physician Assistant: Joretta Bachelor, [place].  Electronically Signed: Verlee Monte, Medical Scribe. 02/14/16. 5:53 PM.  Subjective:    Patient ID: Christy Adams, female    DOB: [birth year], 45 y.o.   MRN: [id number]  HPI Chief Complaint  Patient presents with   Wheezing    waking up with chest tightness. x1week     HPI Comments: Christy Adams is a 45 y.o. female who presents to the [facility] complaining of wheezing onset 1-2 week. Pt mentions she's been using her rescue inhaler every time she wakes up gasping for breath. Pt reports some cough, nasal congestion, and when she blows her nose hard it causes her ears to pop. Pt took Zyrtec, used a neti pot for 20 mins, and taking hot shower for no relief to her symptoms. Pt is allergic to dust and she was cleaning/dusting to paint her daughters rooms this weekend. Pt has anxiety that usually causes upset stomach and she takes Zoloft for her anxiety. Pt mentions she's trying to cope with her stress through journaling, bible study, talking with a counselor, and meditation practice. Pt does yoga for exercise. Pt denies ear pain.  Patient Active Problem List   Diagnosis Date Noted   Depressed 04/09/2012   Past Medical History:  Diagnosis Date   Allergy    Anemia    Anxiety    Asthma    allergic to dust - only uses inhaler prn - rarely   Depression    Headache    otc med prn - rare   Panic attacks    SVD (spontaneous vaginal delivery)    x 3   Past Surgical History:  Procedure Laterality Date   ABLATION  09/10/09   DILATION AND CURETTAGE OF UTERUS  09/10/09   hysteroscopy with Novasure   excision of vaginal mole  01/10/2005   KNEE SURGERY     left   LAPAROSCOPIC TUBAL LIGATION Bilateral 09/14/2014   Procedure: LAPAROSCOPIC TUBAL LIGATION;  Surgeon: Paula Compton, MD;  Location: [facility];  Service: Gynecology;  Laterality: Bilateral;   TONSILLECTOMY     wisedom teeth     No Known Allergies Prior to Admission medications   Medication Sig Start Date End Date Taking? Authorizing Provider  albuterol (PROVENTIL HFA;VENTOLIN HFA) 108 (90 BASE) MCG/ACT inhaler Inhale 2 puffs into the lungs every 6 (six) hours as needed for shortness of breath.   Yes Historical Provider, MD  ibuprofen (ADVIL,MOTRIN) 600 MG tablet Take 1 tablet (600 mg total) by mouth every 6 (six) hours as needed. 09/14/14  Yes Paula Compton, MD  LORazepam (ATIVAN) 1 MG tablet Take 1 mg by mouth every 8 (eight) hours.   Yes Historical Provider, MD  sertraline (ZOLOFT) 100 MG tablet Take 1.5 tablets (150 mg total) by mouth daily. Must have an OV before more refills 05/23/13  Yes Sarah Alleen Borne, PA-C  ofloxacin (OCUFLOX) 0.3 % ophthalmic solution Place 2 drops into both eyes 4 (four) times daily. Patient not taking: Reported on 02/14/2016 03/23/13   Leandrew Koyanagi, MD  oxyCODONE-acetaminophen (ROXICET) 5-325 MG per tablet Take 1 tablet by mouth every 4 (four) hours as needed for severe pain. Patient not taking: Reported on 02/14/2016 09/14/14   Paula Compton, MD   Social History   Social History  Marital status: Legally Separated    Spouse name: N/A   Number of children: N/A   Years of education: N/A   Occupational History   Not on file.   Social History Main Topics   Smoking status: Never Smoker   Smokeless tobacco: Never Used   Alcohol use Yes     Comment: wine at night   Drug use: No   Sexual activity: Not Currently    Partners: Male    Birth control/ protection: None     Comment: husband   Other Topics Concern   Not on file   Social History Narrative   No narrative on file   Depression screen [facility] 2/9 02/14/2016  Decreased Interest 0  Down, Depressed, Hopeless 0  PHQ - 2 Score 0    Review of Systems  HENT: Positive for congestion. Negative for ear pain.   Respiratory: Positive for cough and shortness of breath.   Allergic/Immunologic: Positive for environmental allergies.   Objective: BP 122/78 (BP Location: Right Arm, Patient Position: Sitting, Cuff Size: Normal)    Pulse 60    Temp 98 F (36.7 C) (Oral)    Resp 17    Ht 5\' 6"  (1.676 m)    Wt 192 lb (87.1 kg)    SpO2 100%    BMI 30.99 kg/m   Physical Exam  Constitutional: She appears well-developed and well-nourished. No distress.  HENT:  Head: Normocephalic and atraumatic.  Right Ear: Tympanic membrane normal. Tympanic membrane is not erythematous and not bulging.  Pale cyanotic and edematous nasal turbinates with clear rhinorrhea Cerumen impaction on left ear  Eyes: Conjunctivae are normal.  Neck: Neck supple.  Cardiovascular: Normal rate, regular rhythm and normal heart sounds.  Exam reveals no gallop and no friction rub.   No murmur heard. Pulmonary/Chest: Effort normal and breath sounds normal. No respiratory distress. She has no wheezes. She has no rales.  Lymphadenopathy:    She has no cervical adenopathy.  Neurological: She is alert.  Skin: Skin is warm and dry.  Psychiatric: She has a normal mood and affect. Her behavior is normal.  Nursing note and vitals reviewed.   Assessment & Plan:  45 year old female is here today with wheezing and cough. She was given Mucinex Flonase and cetirizine at this time. Advised that if her symptoms do not improve in the next 3 days she can, and and get an antibiotic if everything has stayed the same. We can give her Z-Pak form.  Allergy, initial encounter - Plan: cetirizine (ZYRTEC) 10 MG tablet, Guaifenesin (MUCINEX MAXIMUM STRENGTH) 1200 MG TB12, fluticasone (FLONASE) 50 MCG/ACT nasal spray  Ivar Drape, PA-C [facility] 8/26/20179:35 PM  I personally performed the services described in this documentation,  which was scribed in my presence. The recorded information has been reviewed and is accurate.

## 2016-02-14 NOTE — Patient Instructions (Addendum)
Please take medication as prescribed. You can also use an air purifier which may get some of the allergens out of the air. Use the flonase until your nasal congestion improves.  Then, you can stay on the zyrtec for the next 2 weeks.  If you stop it and your symptoms return, restart the zyrtec. If your symptoms do not improve in 1 week, you should contact me.  Allergic Rhinitis Allergic rhinitis is when the mucous membranes in the nose respond to allergens. Allergens are particles in the air that cause your body to have an allergic reaction. This causes you to release allergic antibodies. Through a chain of events, these eventually cause you to release histamine into the blood stream. Although meant to protect the body, it is this release of histamine that causes your discomfort, such as frequent sneezing, congestion, and an itchy, runny nose.  CAUSES Seasonal allergic rhinitis (hay fever) is caused by pollen allergens that may come from grasses, trees, and weeds. Year-round allergic rhinitis (perennial allergic rhinitis) is caused by allergens such as house dust mites, pet dander, and mold spores. SYMPTOMS  Nasal stuffiness (congestion).  Itchy, runny nose with sneezing and tearing of the eyes. DIAGNOSIS Your health care provider can help you determine the allergen or allergens that trigger your symptoms. If you and your health care provider are unable to determine the allergen, skin or blood testing may be used. Your health care provider will diagnose your condition after taking your health history and performing a physical exam. Your health care provider may assess you for other related conditions, such as asthma, pink eye, or an ear infection. TREATMENT Allergic rhinitis does not have a cure, but it can be controlled by:  Medicines that block allergy symptoms. These may include allergy shots, nasal sprays, and oral antihistamines.  Avoiding the allergen. Hay fever may often be treated with  antihistamines in pill or nasal spray forms. Antihistamines block the effects of histamine. There are over-the-counter medicines that may help with nasal congestion and swelling around the eyes. Check with your health care provider before taking or giving this medicine. If avoiding the allergen or the medicine prescribed do not work, there are many new medicines your health care provider can prescribe. Stronger medicine may be used if initial measures are ineffective. Desensitizing injections can be used if medicine and avoidance does not work. Desensitization is when a patient is given ongoing shots until the body becomes less sensitive to the allergen. Make sure you follow up with your health care provider if problems continue. HOME CARE INSTRUCTIONS It is not possible to completely avoid allergens, but you can reduce your symptoms by taking steps to limit your exposure to them. It helps to know exactly what you are allergic to so that you can avoid your specific triggers. SEEK MEDICAL CARE IF:  You have a fever.  You develop a cough that does not stop easily (persistent).  You have shortness of breath.  You start wheezing.  Symptoms interfere with normal daily activities.   This information is not intended to replace advice given to you by your health care provider. Make sure you discuss any questions you have with your health care provider.   Document Released: 03/07/2001 Document Revised: 07/03/2014 Document Reviewed: 02/17/2013 Elsevier Interactive Patient Education 2016 [employer].    IF you received an x-ray today, you will receive an invoice from [facility]. Please contact [facility] at [phone] with questions or concerns regarding your invoice.   IF  you received labwork today, you will receive an invoice from [employer]. Please contact [facility] at [phone] with questions or concerns regarding your invoice.   Our billing  staff will not be able to assist you with questions regarding bills from these companies.  You will be contacted with the lab results as soon as they are available. The fastest way to get your results is to activate your My Chart account. Instructions are located on the last page of this paperwork. If you have not heard from [place] regarding the results in 2 weeks, please contact this office.

## 2016-03-04 ENCOUNTER — Ambulatory Visit (INDEPENDENT_AMBULATORY_CARE_PROVIDER_SITE_OTHER): Payer: 59 | Admitting: Family Medicine

## 2016-03-04 VITALS — BP 106/58 | HR 75 | Temp 98.2°F | Resp 16 | Ht 66.0 in | Wt 188.0 lb

## 2016-03-04 DIAGNOSIS — Z23 Encounter for immunization: Secondary | ICD-10-CM

## 2016-03-04 DIAGNOSIS — L259 Unspecified contact dermatitis, unspecified cause: Secondary | ICD-10-CM | POA: Diagnosis not present

## 2016-03-04 MED ORDER — PREDNISONE 20 MG PO TABS: ORAL_TABLET | ORAL | 0 refills | Status: DC

## 2016-03-04 MED ORDER — TRIAMCINOLONE ACETONIDE 0.1 % EX CREA: 1.0000 "application " | TOPICAL_CREAM | Freq: Two times a day (BID) | CUTANEOUS | 0 refills | Status: DC

## 2016-03-04 MED ORDER — HYDROXYZINE HCL 25 MG PO TABS
12.5000 mg | ORAL_TABLET | Freq: Three times a day (TID) | ORAL | 0 refills | Status: DC | PRN
Start: 2016-03-04 — End: 2017-02-27

## 2016-03-04 NOTE — Patient Instructions (Addendum)
Take Prednisone 20 mg, in mornings with breakfast as follows:  Take 3 pills for 3 days, Take 2 pills for 3 days, and Take 1 pill for 3 days.  Complete all medication.  Take hydroxyzine take 0.5-1 tablets every 8 hours as needed for itching.  Ok to use Triamcinolone cream 2 times daily as needed for lesions.    IF you received an x-ray today, you will receive an invoice from [facility]. Please contact [facility] at [phone] with questions or concerns regarding your invoice.   IF you received labwork today, you will receive an invoice from [employer]. Please contact [facility] at [phone] with questions or concerns regarding your invoice.   Our billing staff will not be able to assist you with questions regarding bills from these companies.  You will be contacted with the lab results as soon as they are available. The fastest way to get your results is to activate your My Chart account. Instructions are located on the last page of this paperwork. If you have not heard from [place] regarding the results in 2 weeks, please contact this office.     Poison [employer] ivy is a inflammation of the skin (contact dermatitis) caused by touching the allergens on the leaves of the ivy plant following previous exposure to the plant. The rash usually appears 48 hours after exposure. The rash is usually bumps (papules) or blisters (vesicles) in a linear pattern. Depending on your own sensitivity, the rash may simply cause redness and itching, or it may also progress to blisters which may break open. These must be well cared for to prevent secondary bacterial (germ) infection, followed by scarring. Keep any open areas dry, clean, dressed, and covered with an antibacterial ointment if needed. The eyes may also get puffy. The puffiness is worst in the morning and gets better as the day progresses. This dermatitis usually heals without scarring, within 2 to 3 weeks  without treatment. HOME CARE INSTRUCTIONS  Thoroughly wash with soap and water as soon as you have been exposed to poison ivy. You have about one half hour to remove the plant resin before it will cause the rash. This washing will destroy the oil or antigen on the skin that is causing, or will cause, the rash. Be sure to wash under your fingernails as any plant resin there will continue to spread the rash. Do not rub skin vigorously when washing affected area. Poison ivy cannot spread if no oil from the plant remains on your body. A rash that has progressed to weeping sores will not spread the rash unless you have not washed thoroughly. It is also important to wash any clothes you have been wearing as these may carry active allergens. The rash will return if you wear the unwashed clothing, even several days later. Avoidance of the plant in the future is the best measure. Poison ivy plant can be recognized by the number of leaves. Generally, poison ivy has three leaves with flowering branches on a single stem. Diphenhydramine may be purchased over the counter and used as needed for itching. Do not drive with this medication if it makes you drowsy.Ask your caregiver about medication for children. SEEK MEDICAL CARE IF:  Open sores develop.  Redness spreads beyond area of rash.  You notice purulent (pus-like) discharge.  You have increased pain.  Other signs of infection develop (such as fever).   This information is not intended to replace advice given to you by your  health care provider. Make sure you discuss any questions you have with your health care provider.   Document Released: 06/09/2000 Document Revised: 09/04/2011 Document Reviewed: 11/18/2014 Elsevier Interactive Patient Education [employer].

## 2016-03-04 NOTE — Progress Notes (Signed)
Patient ID: Christy Adams, female    DOB: 1971-01-20, 45 y.o.   MRN: [id number]  PCP: Elyn Peers, MD  Chief Complaint  Patient presents with  . Poison Ivy    Began Tuesday     Subjective:   HPI Presents for evaluation of poison ivy times 4 days. She reports contact with poison IV as she was working out in her yard and removed some brush which contained poison ivy. She initially only noticed a few non-pustule lesions on her arms which she applied triamcinolone cream.  Those lesions improved however she began to develop additional lesions on her neck, in her head,back of her legs, chest, and on her back.   . Social History   Social History  . Marital status: Legally Separated    Spouse name: N/A  . Number of children: N/A  . Years of education: N/A   Occupational History  . Not on file.   Social History Main Topics  . Smoking status: Never Smoker  . Smokeless tobacco: Never Used  . Alcohol use Yes     Comment: wine at night  . Drug use: No  . Sexual activity: Not Currently    Partners: Male    Birth control/ protection: None     Comment: husband   Other Topics Concern  . Not on file   Social History Narrative  . No narrative on file    . Family History  Problem Relation Age of Onset  . Heart disease Mother   . Depression Mother   . Hypertension Mother   . Heart failure Maternal Grandmother   . Diabetes Maternal Grandfather   . Heart attack Maternal Grandfather     Review of Systems  See history of present illness  Patient Active Problem List   Diagnosis Date Noted  . Depressed 04/09/2012     Prior to Admission medications   Medication Sig Start Date End Date Taking? Authorizing Provider  albuterol (PROVENTIL HFA;VENTOLIN HFA) 108 (90 BASE) MCG/ACT inhaler Inhale 2 puffs into the lungs every 6 (six) hours as needed for shortness of breath.   Yes Historical Provider, MD  busPIRone (BUSPAR) 15 MG tablet Take 15 mg by mouth 3 (three) times daily.    Yes Historical Provider, MD  cetirizine (ZYRTEC) 10 MG tablet Take 1 tablet (10 mg total) by mouth daily. 02/14/16  Yes Stephanie D English, PA  fluticasone (FLONASE) 50 MCG/ACT nasal spray Place 2 sprays into both nostrils daily. 02/14/16  Yes Stephanie D English, PA  Guaifenesin (MUCINEX MAXIMUM STRENGTH) 1200 MG TB12 Take 1 tablet (1,200 mg total) by mouth every 12 (twelve) hours as needed. 02/14/16  Yes Stephanie D English, PA  ibuprofen (ADVIL,MOTRIN) 600 MG tablet Take 1 tablet (600 mg total) by mouth every 6 (six) hours as needed. 09/14/14  Yes Paula Compton, MD  LORazepam (ATIVAN) 1 MG tablet Take 1 mg by mouth every 8 (eight) hours.   Yes Historical Provider, MD  sertraline (ZOLOFT) 100 MG tablet Take 1.5 tablets (150 mg total) by mouth daily. Must have an OV before more refills 05/23/13  Yes Sarah Alleen Borne, PA-C     No Known Allergies     Objective:  Physical Exam  Skin: Skin is warm. There is erythema.  Multiple diffuse erythematous nonvesicular lesions bilateral arms, lower legs, sporadically on the chest, a few around the neck, and sporadically on the back. No lesions around the eyes or on the face.  Psychiatric: She has a normal mood  and affect. Her behavior is normal. Judgment and thought content normal.  . Vitals:   03/04/16 1409  BP: (!) 106/58  Pulse: 75  Resp: 16  Temp: 98.2 F (36.8 C)       Assessment & Plan:  1. Contact dermatitis, likely due to contact with an environmental irritant, poison ivy.  . triamcinolone cream (KENALOG) 0.1 %    Sig: Apply 1 application topically 2 (two) times daily.  . hydrOXYzine (ATARAX/VISTARIL) 25 MG tablet    Sig: Take 0.5-1 tablets (12.5-25 mg total) by mouth every 8 (eight) hours as needed for itching.  . predniSONE (DELTASONE) 20 MG tablet    Sig: Take 3 PO QAM x3days, 2 PO QAM x3days, 1 PO QAM x3days   2. Influenza vaccination administered at current visit - Flu Vaccine QUAD 36+ mos IM  Carroll Sage. Kenton Kingfisher, MSN,  FNP-C [facility]

## 2016-07-31 ENCOUNTER — Encounter: Payer: Self-pay | Admitting: Physician Assistant

## 2016-07-31 ENCOUNTER — Ambulatory Visit (INDEPENDENT_AMBULATORY_CARE_PROVIDER_SITE_OTHER): Payer: 59 | Admitting: Physician Assistant

## 2016-07-31 VITALS — BP 108/70 | HR 87 | Temp 98.2°F | Resp 18 | Ht 66.0 in | Wt 198.0 lb

## 2016-07-31 DIAGNOSIS — J020 Streptococcal pharyngitis: Secondary | ICD-10-CM

## 2016-07-31 DIAGNOSIS — J029 Acute pharyngitis, unspecified: Secondary | ICD-10-CM | POA: Diagnosis not present

## 2016-07-31 LAB — POCT RAPID STREP A (OFFICE): Rapid Strep A Screen: POSITIVE — AB

## 2016-07-31 MED ORDER — AMOXICILLIN 500 MG PO CAPS: 500.0000 mg | ORAL_CAPSULE | Freq: Two times a day (BID) | ORAL | 0 refills | Status: AC

## 2016-07-31 NOTE — Patient Instructions (Signed)
Please start antibiotics today.  Warm salt water gargles and hot tea with honey and lemon to soothe sore throat. Please stay well hydrated.  Come back after treatment if you are not better.   Thank you for coming in today. I hope you feel we met your needs.  Feel free to call [facility] if you have any questions or further requests.  Please consider signing up for MyChart if you do not already have it, as this is a great way to communicate with me.  Best,  Whitney McVey, PA-C   Strep Throat Strep throat is a bacterial infection of the throat. Your health care provider may call the infection tonsillitis or pharyngitis, depending on whether there is swelling in the tonsils or at the back of the throat. Strep throat is most common during the cold months of the year in children who are 23-46 years of age, but it can happen during any season in people of any age. This infection is spread from person to person (contagious) through coughing, sneezing, or close contact. What are the causes? Strep throat is caused by the bacteria called Streptococcus pyogenes. What increases the risk? This condition is more likely to develop in:  People who spend time in crowded places where the infection can spread easily.  People who have close contact with someone who has strep throat. What are the signs or symptoms? Symptoms of this condition include:  Fever or chills.  Redness, swelling, or pain in the tonsils or throat.  Pain or difficulty when swallowing.  White or yellow spots on the tonsils or throat.  Swollen, tender glands in the neck or under the jaw.  Red rash all over the body (rare). How is this diagnosed? This condition is diagnosed by performing a rapid strep test or by taking a swab of your throat (throat culture test). Results from a rapid strep test are usually ready in a few minutes, but throat culture test results are available after one or two days. How is this treated? This condition is  treated with antibiotic medicine. Follow these instructions at home: Medicines  Take over-the-counter and prescription medicines only as told by your health care provider.  Take your antibiotic as told by your health care provider. Do not stop taking the antibiotic even if you start to feel better.  Have family members who also have a sore throat or fever tested for strep throat. They may need antibiotics if they have the strep infection. Eating and drinking  Do not share food, drinking cups, or personal items that could cause the infection to spread to other people.  If swallowing is difficult, try eating soft foods until your sore throat feels better.  Drink enough fluid to keep your urine clear or pale yellow. General instructions  Gargle with a salt-water mixture 3-4 times per day or as needed. To make a salt-water mixture, completely dissolve -1 tsp of salt in 1 cup of warm water.  Make sure that all household members wash their hands well.  Get plenty of rest.  Stay home from school or work until you have been taking antibiotics for 24 hours.  Keep all follow-up visits as told by your health care provider. This is important. Contact a health care provider if:  The glands in your neck continue to get bigger.  You develop a rash, cough, or earache.  You cough up a thick liquid that is green, yellow-brown, or bloody.  You have pain or discomfort that does not  get better with medicine.  Your problems seem to be getting worse rather than better.  You have a fever. Get help right away if:  You have new symptoms, such as vomiting, severe headache, stiff or painful neck, chest pain, or shortness of breath.  You have severe throat pain, drooling, or changes in your voice.  You have swelling of the neck, or the skin on the neck becomes red and tender.  You have signs of dehydration, such as fatigue, dry mouth, and decreased urination.  You become increasingly sleepy, or  you cannot wake up completely.  Your joints become red or painful. This information is not intended to replace advice given to you by your health care provider. Make sure you discuss any questions you have with your health care provider. Document Released: 06/09/2000 Document Revised: 02/09/2016 Document Reviewed: 10/05/2014 Elsevier Interactive Patient Education  2017 [employer].

## 2016-07-31 NOTE — Progress Notes (Signed)
Christy Adams  MRN: [id number] DOB: December 20, 1970  PCP: Elyn Peers, MD  Subjective:  Pt is a 46 year old female who presents to clinic for sore throat x two days. C/o Low-grade fever, sore throat, pain with swallowing, weary.  Sleeping well. Denies cough. Her two children +strep throat.  ROS as below.   Review of Systems  Constitutional: Positive for fatigue and fever. Negative for chills and diaphoresis.  HENT: Positive for sore throat. Negative for congestion, postnasal drip, rhinorrhea, sinus pressure and sneezing.   Respiratory: Negative for cough, chest tightness, shortness of breath and wheezing.   Cardiovascular: Negative for chest pain and palpitations.  Gastrointestinal: Negative for abdominal pain, diarrhea, nausea and vomiting.  Neurological: Negative for weakness, light-headedness and headaches.  Psychiatric/Behavioral: Negative for sleep disturbance.    Patient Active Problem List   Diagnosis Date Noted  . Depressed 04/09/2012    Current Outpatient Prescriptions on File Prior to Visit  Medication Sig Dispense Refill  . albuterol (PROVENTIL HFA;VENTOLIN HFA) 108 (90 BASE) MCG/ACT inhaler Inhale 2 puffs into the lungs every 6 (six) hours as needed for shortness of breath.    . busPIRone (BUSPAR) 15 MG tablet Take 15 mg by mouth 3 (three) times daily.    . cetirizine (ZYRTEC) 10 MG tablet Take 1 tablet (10 mg total) by mouth daily. 30 tablet 2  . fluticasone (FLONASE) 50 MCG/ACT nasal spray Place 2 sprays into both nostrils daily. 16 g 2  . Guaifenesin (MUCINEX MAXIMUM STRENGTH) 1200 MG TB12 Take 1 tablet (1,200 mg total) by mouth every 12 (twelve) hours as needed. 14 tablet 1  . ibuprofen (ADVIL,MOTRIN) 600 MG tablet Take 1 tablet (600 mg total) by mouth every 6 (six) hours as needed. 30 tablet 0  . sertraline (ZOLOFT) 100 MG tablet Take 1.5 tablets (150 mg total) by mouth daily. Must have an OV before more refills 45 tablet 0  . triamcinolone cream (KENALOG) 0.1 %  Apply 1 application topically 2 (two) times daily. 30 g 0  . hydrOXYzine (ATARAX/VISTARIL) 25 MG tablet Take 0.5-1 tablets (12.5-25 mg total) by mouth every 8 (eight) hours as needed for itching. (Patient not taking: Reported on 07/31/2016) 30 tablet 0  . LORazepam (ATIVAN) 1 MG tablet Take 1 mg by mouth every 8 (eight) hours.     No current facility-administered medications on file prior to visit.     No Known Allergies   Objective:  BP 108/70 (BP Location: Right Arm, Patient Position: Sitting, Cuff Size: Small)   Pulse 87   Temp 98.2 F (36.8 C) (Oral)   Resp 18   Ht 5\' 6"  (1.676 m)   Wt 198 lb (89.8 kg)   SpO2 97%   BMI 31.96 kg/m   Physical Exam  Constitutional: She is oriented to person, place, and time and well-developed, well-nourished, and in no distress. No distress.  HENT:  Right Ear: Tympanic membrane normal.  Left Ear: Tympanic membrane normal.  Mouth/Throat: Mucous membranes are normal. Posterior oropharyngeal edema and posterior oropharyngeal erythema present. No oropharyngeal exudate.  Cardiovascular: Normal rate, regular rhythm and normal heart sounds.   Pulmonary/Chest: Effort normal. No respiratory distress.  Neurological: She is alert and oriented to person, place, and time. GCS score is 15.  Skin: Skin is warm and dry.  Psychiatric: Mood, memory, affect and judgment normal.  Vitals reviewed.  Results for orders placed or performed in visit on 07/31/16  POCT rapid strep A  Result Value Ref Range   Rapid  Strep A Screen Positive (A) Negative    Assessment and Plan :  1. Streptococcal sore throat 2. Sore throat - Culture, Group A Strep - POCT rapid strep A - amoxicillin (AMOXIL) 500 MG capsule; Take 1 capsule (500 mg total) by mouth 2 (two) times daily.  Dispense: 20 capsule; Refill: 0 - Positive rapid strep, will treat. Supportive care discussed. RTC in 5-7 days if no improvement.   Mercer Pod, PA-C  [facility] 07/31/2016 12:22 PM

## 2016-08-02 LAB — CULTURE, GROUP A STREP: Strep A Culture: POSITIVE — AB

## 2016-08-14 DIAGNOSIS — M9901 Segmental and somatic dysfunction of cervical region: Secondary | ICD-10-CM | POA: Diagnosis not present

## 2016-08-14 DIAGNOSIS — M9903 Segmental and somatic dysfunction of lumbar region: Secondary | ICD-10-CM | POA: Diagnosis not present

## 2016-08-14 DIAGNOSIS — M9905 Segmental and somatic dysfunction of pelvic region: Secondary | ICD-10-CM | POA: Diagnosis not present

## 2016-08-16 ENCOUNTER — Ambulatory Visit (INDEPENDENT_AMBULATORY_CARE_PROVIDER_SITE_OTHER): Payer: 59 | Admitting: Emergency Medicine

## 2016-08-16 VITALS — BP 102/60 | HR 74 | Temp 98.2°F | Resp 18 | Ht 66.0 in | Wt 198.0 lb

## 2016-08-16 DIAGNOSIS — B349 Viral infection, unspecified: Secondary | ICD-10-CM | POA: Diagnosis not present

## 2016-08-16 DIAGNOSIS — J111 Influenza due to unidentified influenza virus with other respiratory manifestations: Secondary | ICD-10-CM | POA: Diagnosis not present

## 2016-08-16 LAB — POCT INFLUENZA A/B
Influenza A, POC: NEGATIVE
Influenza B, POC: NEGATIVE

## 2016-08-16 NOTE — Patient Instructions (Addendum)
   IF you received an x-ray today, you will receive an invoice from [facility]. Please contact [facility] at [phone] with questions or concerns regarding your invoice.   IF you received labwork today, you will receive an invoice from [facility]. Please contact [facility] at [phone] with questions or concerns regarding your invoice.   Our billing staff will not be able to assist you with questions regarding bills from these companies.  You will be contacted with the lab results as soon as they are available. The fastest way to get your results is to activate your My Chart account. Instructions are located on the last page of this paperwork. If you have not heard from us regarding the results in 2 weeks, please contact this office.     Viral Illness, Adult Viruses are tiny germs that can get into a person's body and cause illness. There are many different types of viruses, and they cause many types of illness. Viral illnesses can range from mild to severe. They can affect various parts of the body. Common illnesses that are caused by a virus include colds and the flu. Viral illnesses also include serious conditions such as HIV/AIDS (human immunodeficiency virus/acquired immunodeficiency syndrome). A few viruses have been linked to certain cancers. What are the causes? Many types of viruses can cause illness. Viruses invade cells in your body, multiply, and cause the infected cells to malfunction or die. When the cell dies, it releases more of the virus. When this happens, you develop symptoms of the illness, and the virus continues to spread to other cells. If the virus takes over the function of the cell, it can cause the cell to divide and grow out of control, as is the case when a virus causes cancer. Different viruses get into the body in different ways. You can get a virus by:  Swallowing food or water that is contaminated with the virus.  Breathing in droplets  that have been coughed or sneezed into the air by an infected person.  Touching a surface that has been contaminated with the virus and then touching your eyes, nose, or mouth.  Being bitten by an insect or animal that carries the virus.  Having sexual contact with a person who is infected with the virus.  Being exposed to blood or fluids that contain the virus, either through an open cut or during a transfusion. If a virus enters your body, your body's defense system (immune system) will try to fight the virus. You may be at higher risk for a viral illness if your immune system is weak. What are the signs or symptoms? Symptoms vary depending on the type of virus and the location of the cells that it invades. Common symptoms of the main types of viral illnesses include: Cold and flu viruses   Fever.  Headache.  Sore throat.  Muscle aches.  Nasal congestion.  Cough. Digestive system (gastrointestinal) viruses   Fever.  Abdominal pain.  Nausea.  Diarrhea. Liver viruses (hepatitis)   Loss of appetite.  Tiredness.  Yellowing of the skin (jaundice). Brain and spinal cord viruses   Fever.  Headache.  Stiff neck.  Nausea and vomiting.  Confusion or sleepiness. Skin viruses   Warts.  Itching.  Rash. Sexually transmitted viruses   Discharge.  Swelling.  Redness.  Rash. How is this treated? Viruses can be difficult to treat because they live within cells. Antibiotic medicines do not treat viruses because these drugs do not get inside cells.   Treatment for a viral illness may include:  Resting and drinking plenty of fluids.  Medicines to relieve symptoms. These can include over-the-counter medicine for pain and fever, medicines for cough or congestion, and medicines to relieve diarrhea.  Antiviral medicines. These drugs are available only for certain types of viruses. They may help reduce flu symptoms if taken early. There are also many antiviral  medicines for hepatitis and HIV/AIDS. Some viral illnesses can be prevented with vaccinations. A common example is the flu shot. Follow these instructions at home: Medicines    Take over-the-counter and prescription medicines only as told by your health care provider.  If you were prescribed an antiviral medicine, take it as told by your health care provider. Do not stop taking the medicine even if you start to feel better.  Be aware of when antibiotics are needed and when they are not needed. Antibiotics do not treat viruses. If your health care provider thinks that you may have a bacterial infection as well as a viral infection, you may get an antibiotic.  Do not ask for an antibiotic prescription if you have been diagnosed with a viral illness. That will not make your illness go away faster.  Frequently taking antibiotics when they are not needed can lead to antibiotic resistance. When this develops, the medicine no longer works against the bacteria that it normally fights. General instructions   Drink enough fluids to keep your urine clear or pale yellow.  Rest as much as possible.  Return to your normal activities as told by your health care provider. Ask your health care provider what activities are safe for you.  Keep all follow-up visits as told by your health care provider. This is important. How is this prevented? Take these actions to reduce your risk of viral infection:  Eat a healthy diet and get enough rest.  Wash your hands often with soap and water. This is especially important when you are in public places. If soap and water are not available, use hand sanitizer.  Avoid close contact with friends and family who have a viral illness.  If you travel to areas where viral gastrointestinal infection is common, avoid drinking water or eating raw food.  Keep your immunizations up to date. Get a flu shot every year as told by your health care provider.  Do not share  toothbrushes, nail clippers, razors, or needles with other people.  Always practice safe sex. Contact a health care provider if:  You have symptoms of a viral illness that do not go away.  Your symptoms come back after going away.  Your symptoms get worse. Get help right away if:  You have trouble breathing.  You have a severe headache or a stiff neck.  You have severe vomiting or abdominal pain. This information is not intended to replace advice given to you by your health care provider. Make sure you discuss any questions you have with your health care provider. Document Released: 10/22/2015 Document Revised: 11/24/2015 Document Reviewed: 10/22/2015 Elsevier Interactive Patient Education  2017 [employer]   

## 2016-08-16 NOTE — Progress Notes (Signed)
Christy Adams 46 y.o.   Chief Complaint  Patient presents with  . Fever    yesterday  . Influenza    wants to be tested for flu  . Muscle Pain    HISTORY OF PRESENT ILLNESS: This is a 46 y.o. female complaining of abrupt onset of generalized achiness/weakness with fever, nausea, and diarrhea that started yesterday; better today; children at home sick with same.  HPI   Prior to Admission medications   Medication Sig Start Date End Date Taking? Authorizing Provider  albuterol (PROVENTIL HFA;VENTOLIN HFA) 108 (90 BASE) MCG/ACT inhaler Inhale 2 puffs into the lungs every 6 (six) hours as needed for shortness of breath.   Yes Historical Provider, MD  busPIRone (BUSPAR) 15 MG tablet Take 15 mg by mouth 3 (three) times daily.   Yes Historical Provider, MD  cetirizine (ZYRTEC) 10 MG tablet Take 1 tablet (10 mg total) by mouth daily. 02/14/16  Yes Stephanie D English, PA  fluticasone (FLONASE) 50 MCG/ACT nasal spray Place 2 sprays into both nostrils daily. 02/14/16  Yes Stephanie D English, PA  ibuprofen (ADVIL,MOTRIN) 600 MG tablet Take 1 tablet (600 mg total) by mouth every 6 (six) hours as needed. 09/14/14  Yes Paula Compton, MD  sertraline (ZOLOFT) 100 MG tablet Take 1.5 tablets (150 mg total) by mouth daily. Must have an OV before more refills 05/23/13  Yes Sarah Alleen Borne, PA-C  Guaifenesin [facility] MAXIMUM STRENGTH) 1200 MG TB12 Take 1 tablet (1,200 mg total) by mouth every 12 (twelve) hours as needed. Patient not taking: Reported on 08/16/2016 02/14/16   Dorian Heckle English, PA  hydrOXYzine (ATARAX/VISTARIL) 25 MG tablet Take 0.5-1 tablets (12.5-25 mg total) by mouth every 8 (eight) hours as needed for itching. Patient not taking: Reported on 08/16/2016 03/04/16   Sedalia Muta, FNP  LORazepam (ATIVAN) 1 MG tablet Take 1 mg by mouth every 8 (eight) hours.    Historical Provider, MD  triamcinolone cream (KENALOG) 0.1 % Apply 1 application topically 2 (two) times daily. Patient  not taking: Reported on 08/16/2016 03/04/16   Sedalia Muta, FNP    No Known Allergies  Patient Active Problem List   Diagnosis Date Noted  . Depressed 04/09/2012    Past Medical History:  Diagnosis Date  . Allergy   . Anemia   . Anxiety   . Asthma    allergic to dust - only uses inhaler prn - rarely  . Depression   . Headache    otc med prn - rare  . Panic attacks   . SVD (spontaneous vaginal delivery)    x 3    Past Surgical History:  Procedure Laterality Date  . ABLATION  09/10/09  . DILATION AND CURETTAGE OF UTERUS  09/10/09   hysteroscopy with Novasure  . excision of vaginal mole  01/10/2005  . KNEE SURGERY     left  . LAPAROSCOPIC TUBAL LIGATION Bilateral 09/14/2014   Procedure: LAPAROSCOPIC TUBAL LIGATION;  Surgeon: Paula Compton, MD;  Location: [facility];  Service: Gynecology;  Laterality: Bilateral;  . TONSILLECTOMY    . wisedom teeth      Social History   Social History  . Marital status: Legally Separated    Spouse name: N/A  . Number of children: N/A  . Years of education: N/A   Occupational History  . Not on file.   Social History Main Topics  . Smoking status: Never Smoker  . Smokeless tobacco: Never Used  . Alcohol use Yes  Comment: wine at night  . Drug use: No  . Sexual activity: Not Currently    Partners: Male    Birth control/ protection: None     Comment: husband   Other Topics Concern  . Not on file   Social History Narrative  . No narrative on file    Family History  Problem Relation Age of Onset  . Heart disease Mother   . Depression Mother   . Hypertension Mother   . Heart failure Maternal Grandmother   . Diabetes Maternal Grandfather   . Heart attack Maternal Grandfather      Review of Systems  Constitutional: Positive for chills, fever and malaise/fatigue.  HENT: Negative for congestion, nosebleeds, sinus pain and sore throat.   Eyes: Negative for discharge and redness.  Respiratory: Negative for  cough, shortness of breath and wheezing.   Cardiovascular: Negative for chest pain, palpitations and leg swelling.  Gastrointestinal: Positive for diarrhea and nausea. Negative for abdominal pain and blood in stool.  Genitourinary: Negative for dysuria and hematuria.  Musculoskeletal: Positive for myalgias.  Skin: Negative for rash.  Neurological: Negative for dizziness and headaches.  All other systems reviewed and are negative.  Vitals:   08/16/16 1523  BP: 102/60  Pulse: 74  Resp: 18  Temp: 98.2 F (36.8 C)     Physical Exam  Constitutional: She is oriented to person, place, and time. She appears well-developed and well-nourished.  HENT:  Head: Normocephalic and atraumatic.  Nose: Nose normal.  Mouth/Throat: Oropharynx is clear and moist. No oropharyngeal exudate.  Eyes: Conjunctivae and EOM are normal. Pupils are equal, round, and reactive to light.  Neck: Normal range of motion. Neck supple. No JVD present. No thyromegaly present.  Cardiovascular: Normal rate, regular rhythm and normal heart sounds.   Pulmonary/Chest: Effort normal and breath sounds normal.  Abdominal: Soft. Bowel sounds are normal.  Musculoskeletal: Normal range of motion.  Lymphadenopathy:    She has no cervical adenopathy.  Neurological: She is alert and oriented to person, place, and time. No sensory deficit. She exhibits normal muscle tone.  Skin: Skin is warm and dry. Capillary refill takes less than 2 seconds.  Psychiatric: She has a normal mood and affect. Her behavior is normal.  Vitals reviewed.    ASSESSMENT & PLAN: Christy Adams was seen today for fever, influenza and muscle pain.  Diagnoses and all orders for this visit:  Influenza with respiratory manifestation other than pneumonia -     POCT Influenza A/B  Viral illness    Patient Instructions       IF you received an x-ray today, you will receive an invoice from [facility]. Please contact [facility] at  [phone] with questions or concerns regarding your invoice.   IF you received labwork today, you will receive an invoice from [facility]. Please contact [facility] at [phone] with questions or concerns regarding your invoice.   Our billing staff will not be able to assist you with questions regarding bills from these companies.  You will be contacted with the lab results as soon as they are available. The fastest way to get your results is to activate your My Chart account. Instructions are located on the last page of this paperwork. If you have not heard from [place] regarding the results in 2 weeks, please contact this office.     Viral Illness, Adult Viruses are tiny germs that can get into a person's body and cause illness. There are many different types of viruses, and  they cause many types of illness. Viral illnesses can range from mild to severe. They can affect various parts of the body. Common illnesses that are caused by a virus include colds and the flu. Viral illnesses also include serious conditions such as HIV/AIDS (human immunodeficiency virus/acquired immunodeficiency syndrome). A few viruses have been linked to certain cancers. What are the causes? Many types of viruses can cause illness. Viruses invade cells in your body, multiply, and cause the infected cells to malfunction or die. When the cell dies, it releases more of the virus. When this happens, you develop symptoms of the illness, and the virus continues to spread to other cells. If the virus takes over the function of the cell, it can cause the cell to divide and grow out of control, as is the case when a virus causes cancer. Different viruses get into the body in different ways. You can get a virus by:  Swallowing food or water that is contaminated with the virus.  Breathing in droplets that have been coughed or sneezed into the air by an infected person.  Touching a surface that has been contaminated with the virus  and then touching your eyes, nose, or mouth.  Being bitten by an insect or animal that carries the virus.  Having sexual contact with a person who is infected with the virus.  Being exposed to blood or fluids that contain the virus, either through an open cut or during a transfusion. If a virus enters your body, your body's defense system (immune system) will try to fight the virus. You may be at higher risk for a viral illness if your immune system is weak. What are the signs or symptoms? Symptoms vary depending on the type of virus and the location of the cells that it invades. Common symptoms of the main types of viral illnesses include: Cold and flu viruses  Fever.  Headache.  Sore throat.  Muscle aches.  Nasal congestion.  Cough. Digestive system (gastrointestinal) viruses  Fever.  Abdominal pain.  Nausea.  Diarrhea. Liver viruses (hepatitis)  Loss of appetite.  Tiredness.  Yellowing of the skin (jaundice). Brain and spinal cord viruses  Fever.  Headache.  Stiff neck.  Nausea and vomiting.  Confusion or sleepiness. Skin viruses  Warts.  Itching.  Rash. Sexually transmitted viruses  Discharge.  Swelling.  Redness.  Rash. How is this treated? Viruses can be difficult to treat because they live within cells. Antibiotic medicines do not treat viruses because these drugs do not get inside cells. Treatment for a viral illness may include:  Resting and drinking plenty of fluids.  Medicines to relieve symptoms. These can include over-the-counter medicine for pain and fever, medicines for cough or congestion, and medicines to relieve diarrhea.  Antiviral medicines. These drugs are available only for certain types of viruses. They may help reduce flu symptoms if taken early. There are also many antiviral medicines for hepatitis and HIV/AIDS. Some viral illnesses can be prevented with vaccinations. A common example is the flu shot. Follow these  instructions at home: Medicines  Take over-the-counter and prescription medicines only as told by your health care provider.  If you were prescribed an antiviral medicine, take it as told by your health care provider. Do not stop taking the medicine even if you start to feel better.  Be aware of when antibiotics are needed and when they are not needed. Antibiotics do not treat viruses. If your health care provider thinks that you may have a  bacterial infection as well as a viral infection, you may get an antibiotic.  Do not ask for an antibiotic prescription if you have been diagnosed with a viral illness. That will not make your illness go away faster.  Frequently taking antibiotics when they are not needed can lead to antibiotic resistance. When this develops, the medicine no longer works against the bacteria that it normally fights. General instructions  Drink enough fluids to keep your urine clear or pale yellow.  Rest as much as possible.  Return to your normal activities as told by your health care provider. Ask your health care provider what activities are safe for you.  Keep all follow-up visits as told by your health care provider. This is important. How is this prevented? Take these actions to reduce your risk of viral infection:  Eat a healthy diet and get enough rest.  Wash your hands often with soap and water. This is especially important when you are in public places. If soap and water are not available, use hand sanitizer.  Avoid close contact with friends and family who have a viral illness.  If you travel to areas where viral gastrointestinal infection is common, avoid drinking water or eating raw food.  Keep your immunizations up to date. Get a flu shot every year as told by your health care provider.  Do not share toothbrushes, nail clippers, razors, or needles with other people.  Always practice safe sex. Contact a health care provider if:  You have symptoms  of a viral illness that do not go away.  Your symptoms come back after going away.  Your symptoms get worse. Get help right away if:  You have trouble breathing.  You have a severe headache or a stiff neck.  You have severe vomiting or abdominal pain. This information is not intended to replace advice given to you by your health care provider. Make sure you discuss any questions you have with your health care provider. Document Released: 10/22/2015 Document Revised: 11/24/2015 Document Reviewed: 10/22/2015 Elsevier Interactive Patient Education  2017 Elsevier Inc.      Agustina Caroli, MD [facility]

## 2016-08-18 DIAGNOSIS — M9905 Segmental and somatic dysfunction of pelvic region: Secondary | ICD-10-CM | POA: Diagnosis not present

## 2016-08-18 DIAGNOSIS — M9901 Segmental and somatic dysfunction of cervical region: Secondary | ICD-10-CM | POA: Diagnosis not present

## 2016-08-18 DIAGNOSIS — M9903 Segmental and somatic dysfunction of lumbar region: Secondary | ICD-10-CM | POA: Diagnosis not present

## 2016-08-21 DIAGNOSIS — M9903 Segmental and somatic dysfunction of lumbar region: Secondary | ICD-10-CM | POA: Diagnosis not present

## 2016-08-21 DIAGNOSIS — M9905 Segmental and somatic dysfunction of pelvic region: Secondary | ICD-10-CM | POA: Diagnosis not present

## 2016-08-21 DIAGNOSIS — M9901 Segmental and somatic dysfunction of cervical region: Secondary | ICD-10-CM | POA: Diagnosis not present

## 2017-02-18 DIAGNOSIS — Z23 Encounter for immunization: Secondary | ICD-10-CM | POA: Diagnosis not present

## 2017-02-27 ENCOUNTER — Encounter: Payer: Self-pay | Admitting: Family Medicine

## 2017-02-27 ENCOUNTER — Ambulatory Visit (INDEPENDENT_AMBULATORY_CARE_PROVIDER_SITE_OTHER): Payer: 59 | Admitting: Family Medicine

## 2017-02-27 VITALS — BP 102/64 | HR 71 | Ht 66.5 in | Wt 195.0 lb

## 2017-02-27 DIAGNOSIS — J4520 Mild intermittent asthma, uncomplicated: Secondary | ICD-10-CM | POA: Diagnosis not present

## 2017-02-27 DIAGNOSIS — Z7689 Persons encountering health services in other specified circumstances: Secondary | ICD-10-CM

## 2017-02-27 DIAGNOSIS — F419 Anxiety disorder, unspecified: Secondary | ICD-10-CM

## 2017-02-27 DIAGNOSIS — Z8349 Family history of other endocrine, nutritional and metabolic diseases: Secondary | ICD-10-CM

## 2017-02-27 NOTE — Progress Notes (Signed)
   Subjective:    Patient ID: Christy Adams, female    DOB: 03/26/1971, 46 y.o.   MRN: [id number]  HPI Chief Complaint  Patient presents with  . new pt    new pt get established. no concerns. already had flu shot   She is new to the practice and here to establish care.  Previous medical care: Dr. Criss Rosales. Dr. Marvel Plan at [facility] is [facility].  Last CPE: 4 years ago but has been getting check ups at her OB/GYN. No recent lipid check.   Other providers: OB/GYN, Dermatologist.   States she has anxiety and currently on medication that is effective and is also seeing a therapist at [facility]  No concerns with this today.  No sign of depression.   States she has a history of asthma that is triggered by allergies, mainly dust and only needs her rescue inhaler a couple of times per year.  Family history: father passed away at age 87 with a heart attack. Mother with thyroid disorder, MVP and surgery for valve replacement. Aunt with CF and diagnosed at age 79. Mother had a stroke  States she had a normal EKG 4 years ago after her father had a heart attack.   Social history: Lives with 3 kids ages 29, 59 and 70, divorced, works as an Forensic psychologist.   Immunizations: UTD per patient  Health maintenance:  Mammogram: up to date Colonoscopy: never, no family history  Last Gynecological Exam: up to date with Gyn  Reviewed allergies, medications, past medical, surgical, family, and social history.   Review of Systems Pertinent positives and negatives in the history of present illness.     Objective:   Physical Exam BP 102/64   Pulse 71   Ht 5' 6.5" (1.689 m)   Wt 195 lb (88.5 kg)   BMI 31.00 kg/m  Alert and oriented an in no acute distress. Not otherwise examined.       Assessment & Plan:  Mild intermittent extrinsic asthma without complication  Encounter to establish care  Anxiety  Family history of thyroid disease in mother  She appears to be doing well  and anxiety seems to be managed with medications and therapy.  No recent asthma flares and she avoids triggers, dust, when she can.  Reports having heart and thyroid disease in parents with a normal EKG 4 years ago at her previous PCP, no EKG on file. She will follow up with me for a fasting CPE at her convenience.

## 2017-03-02 ENCOUNTER — Telehealth: Payer: Self-pay | Admitting: Internal Medicine

## 2017-03-02 NOTE — Telephone Encounter (Signed)
Called & explained this to patient.  She will call her OB Gyn & try to expedite records.  And cancelled labs for time being and she will call back in a few days to see if we've recv'd the records.

## 2017-03-02 NOTE — Telephone Encounter (Signed)
Beverlee Nims put pt on the schedule for Monday 9/10 for labs. Please put in future orders for pt

## 2017-03-02 NOTE — Telephone Encounter (Signed)
I cannot put in labs for her at this point. I recommend that she wait until I receive records from her OB/GYN. Also, in order for insurance to cover her blood work, I think she must come in closer to her CPE.

## 2017-03-02 NOTE — Telephone Encounter (Signed)
I need most recent labs from [facility]N, [facility]

## 2017-03-02 NOTE — Telephone Encounter (Signed)
[facility] has already closed for the day. We can not get records before pt comes in Monday morning.

## 2017-03-05 ENCOUNTER — Other Ambulatory Visit: Payer: 59

## 2017-03-14 ENCOUNTER — Ambulatory Visit (INDEPENDENT_AMBULATORY_CARE_PROVIDER_SITE_OTHER): Payer: 59 | Admitting: Family Medicine

## 2017-03-14 ENCOUNTER — Encounter: Payer: Self-pay | Admitting: Family Medicine

## 2017-03-14 VITALS — BP 110/68 | HR 60 | Ht 66.25 in | Wt 199.6 lb

## 2017-03-14 DIAGNOSIS — Z6831 Body mass index (BMI) 31.0-31.9, adult: Secondary | ICD-10-CM

## 2017-03-14 DIAGNOSIS — Z0000 Encounter for general adult medical examination without abnormal findings: Secondary | ICD-10-CM | POA: Diagnosis not present

## 2017-03-14 DIAGNOSIS — G4719 Other hypersomnia: Secondary | ICD-10-CM | POA: Diagnosis not present

## 2017-03-14 DIAGNOSIS — Z23 Encounter for immunization: Secondary | ICD-10-CM

## 2017-03-14 DIAGNOSIS — Z7185 Encounter for immunization safety counseling: Secondary | ICD-10-CM

## 2017-03-14 DIAGNOSIS — Z8669 Personal history of other diseases of the nervous system and sense organs: Secondary | ICD-10-CM | POA: Diagnosis not present

## 2017-03-14 DIAGNOSIS — Z7189 Other specified counseling: Secondary | ICD-10-CM | POA: Diagnosis not present

## 2017-03-14 DIAGNOSIS — Z113 Encounter for screening for infections with a predominantly sexual mode of transmission: Secondary | ICD-10-CM

## 2017-03-14 LAB — POCT URINALYSIS DIP (PROADVANTAGE DEVICE)
BILIRUBIN UA: NEGATIVE mg/dL
Bilirubin, UA: NEGATIVE
Blood, UA: NEGATIVE
Glucose, UA: NEGATIVE mg/dL
LEUKOCYTES UA: NEGATIVE
NITRITE UA: NEGATIVE
PROTEIN UA: NEGATIVE mg/dL
Specific Gravity, Urine: 1.015
Urobilinogen, Ur: NEGATIVE
pH, UA: 7 (ref 5.0–8.0)

## 2017-03-14 NOTE — Progress Notes (Signed)
Subjective:    Patient ID: Christy Adams, female    DOB: 26-Jul-1970, 46 y.o.   MRN: [id number]  HPI Chief Complaint  Patient presents with  . cpe    cpe did not get labs done last week. not fasting. eye exam done within a year.    She is here for a complete physical exam.  Dr. Marvel Plan at [facility] is her OB/GYN. She also has a Paediatric nurse.  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 She is up to date with her visits.  States anxiety medication and counseling is going well. No concerns with mood.   She would like to discuss weight loss. States she has not been exercising.  States she feels tired most of the time.  States she was told several years ago that she had sleep apnea. Has not used a CPAP in the past. States she falls asleep sitting up often during the day. Denies feeling depressed.   Diet: unhealthy   Immunizations: Tdap unknown. Flu shot up to date.   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   Wears seatbelt always, uses sunscreen, smoke detectors in home and functioning, does not text while driving and feels safe in home environment.   Reviewed allergies, medications, past medical, surgical, family, and social history.   Review of Systems Review of Systems Constitutional: -fever, -chills, -sweats, -unexpected weight change,+fatigue ENT: -runny nose, -ear pain, -sore throat Cardiology:  -chest pain, -palpitations, -edema Respiratory: -cough, -shortness of breath, -wheezing Gastroenterology: -abdominal pain, -nausea, -vomiting, -diarrhea, -constipation  Hematology: -bleeding or bruising problems Musculoskeletal: -arthralgias, -myalgias, -joint swelling, -back pain Ophthalmology: -vision changes Urology: -dysuria, -difficulty  urinating, -hematuria, -urinary frequency, -urgency Neurology: -headache, -weakness, -tingling, -numbness       Objective:   Physical Exam BP 110/68   Pulse 60   Ht 5' 6.25" (1.683 m)   Wt 199 lb 9.6 oz (90.5 kg)   BMI 31.97 kg/m   General Appearance:    Alert, cooperative, no distress, appears stated age  Head:    Normocephalic, without obvious abnormality, atraumatic  Eyes:    PERRL, conjunctiva/corneas clear, EOM's intact, fundi    benign  Ears:    Normal TM's and external ear canals  Nose:   Nares normal, mucosa normal, no drainage or sinus   tenderness  Throat:   Lips, mucosa, and tongue normal; teeth and gums normal  Neck:   Supple, no lymphadenopathy;  thyroid:  no   enlargement/tenderness/nodules; no carotid   bruit or JVD  Back:    Spine nontender, no curvature, ROM normal, no CVA     tenderness  Lungs:     Clear to auscultation bilaterally without wheezes, rales or     ronchi; respirations unlabored  Chest Wall:    No tenderness or deformity   Heart:    Regular rate and rhythm, S1 and S2 normal, no murmur, rub   or gallop  Breast Exam:    OB/GYN   Abdomen:     Soft, non-tender, nondistended, normoactive bowel sounds,    no masses, no hepatosplenomegaly  Genitalia:    Done at OB/GYN     Extremities:   No clubbing, cyanosis or edema  Pulses:   2+ and symmetric all extremities  Skin:   Skin color, texture, turgor normal, no rashes or lesions  Lymph nodes:   Cervical, supraclavicular, and axillary nodes normal  Neurologic:   CNII-XII intact, normal strength, sensation and gait; reflexes 2+ and symmetric throughout          Psych:   Normal mood, affect, hygiene and grooming.    Urinalysis dipstick: neg       Assessment & Plan:  Routine general medical examination at a health care facility - Plan: POCT Urinalysis DIP (Proadvantage Device), CBC with Differential/Platelet, Comprehensive metabolic panel, TSH, Lipid panel  Excessive daytime sleepiness - Plan: CBC with  Differential/Platelet, Comprehensive metabolic panel, TSH, Home sleep test  History of sleep apnea - Plan: Home sleep test  Screen for STD (sexually transmitted disease) - Plan: RPR, HIV antibody, C. trachomatis/N. gonorrhoeae RNA  Need for Tdap vaccination - Plan: Tdap vaccine greater than or equal to 7yo IM  Vaccine counseling  BMI 31.0-31.9,adult  She appears to be doing well emotionally. Mood is stable.  Discussed that fatigue may be related to sleep apnea. She does not appear to be depressed.  Epworth sleep scale is 18. She has a history of sleep apnea that has been untreated. Plan to send her for a sleep study.  Counseled on weight loss. Advised to avoid white food and carbohydrates, cut back on portion sizes. Use a free app to track calories.  Find an exercise she and her younger daughter can do together, she mentioned getting a dog to walk and this is a great idea.  STD screening per patient request.  Tdap given and counseling done on each component of the Tdap.  Will follow up pending labs and sleep study. She is not fasting and will return for a fasting lipid panel. Order is in.

## 2017-03-14 NOTE — Patient Instructions (Signed)
Return for fasting cholesterol check. Nothing to eat or drink except water for at least 6 hours. Call and schedule a lab visit for this.   Start using a free app such as My Fitness Pal to track daily calories and nutrition intake.  Cut back on white foods and portion sizes. Skip meat some meals.  Make sure you are getting at least 150 minutes of physical activity per week.   Preventative Care for Adults - Female      MAINTAIN REGULAR HEALTH EXAMS:  A routine yearly physical is a good way to check in with your primary care provider about your health and preventive screening. It is also an opportunity to share updates about your health and any concerns you have, and receive a thorough all-over exam.   Most health insurance companies pay for at least some preventative services.  Check with your health plan for specific coverages.  WHAT PREVENTATIVE SERVICES DO WOMEN NEED?  Adult women should have their weight and blood pressure checked regularly.   Women age 68 and older should have their cholesterol levels checked regularly.  Women should be screened for cervical cancer with a Pap smear and pelvic exam beginning at age 40.  Breast cancer screening generally begins at age 28 with a mammogram and breast exam by your primary care provider.    Beginning at age 69 and continuing to age 33, women should be screened for colorectal cancer.  Certain people may need continued testing until age 78.  Updating vaccinations is part of preventative care.  Vaccinations help protect against diseases such as the flu.  Osteoporosis is a disease in which the bones lose minerals and strength as we age. Women ages 87 and over should discuss this with their caregivers, as should women after menopause who have other risk factors.  Lab tests are generally done as part of preventative care to screen for anemia and blood disorders, to screen for problems with the kidneys and liver, to screen for bladder problems,  to check blood sugar, and to check your cholesterol level.  Preventative services generally include counseling about diet, exercise, avoiding tobacco, drugs, excessive alcohol consumption, and sexually transmitted infections.    GENERAL RECOMMENDATIONS FOR GOOD HEALTH:  Healthy diet:  Eat a variety of foods, including fruit, vegetables, animal or vegetable protein, such as meat, fish, chicken, and eggs, or beans, lentils, tofu, and grains, such as rice.  Drink plenty of water daily.  Decrease saturated fat in the diet, avoid lots of red meat, processed foods, sweets, fast foods, and fried foods.  Exercise:  Aerobic exercise helps maintain good heart health. At least 30-40 minutes of moderate-intensity exercise is recommended. For example, a brisk walk that increases your heart rate and breathing. This should be done on most days of the week.   Find a type of exercise or a variety of exercises that you enjoy so that it becomes a part of your daily life.  Examples are running, walking, swimming, water aerobics, and biking.  For motivation and support, explore group exercise such as aerobic class, spin class, Zumba, Yoga,or  martial arts, etc.    Set exercise goals for yourself, such as a certain weight goal, walk or run in a race such as a 5k walk/run.  Speak to your primary care provider about exercise goals.  Disease prevention:  If you smoke or chew tobacco, find out from your caregiver how to quit. It can literally save your life, no matter how long you  have been a tobacco user. If you do not use tobacco, never begin.   Maintain a healthy diet and normal weight. Increased weight leads to problems with blood pressure and diabetes.   The Body Mass Index or BMI is a way of measuring how much of your body is fat. Having a BMI above 27 increases the risk of heart disease, diabetes, hypertension, stroke and other problems related to obesity. Your caregiver can help determine your BMI and  based on it develop an exercise and dietary program to help you achieve or maintain this important measurement at a healthful level.  High blood pressure causes heart and blood vessel problems.  Persistent high blood pressure should be treated with medicine if weight loss and exercise do not work.   Fat and cholesterol leaves deposits in your arteries that can block them. This causes heart disease and vessel disease elsewhere in your body.  If your cholesterol is found to be high, or if you have heart disease or certain other medical conditions, then you may need to have your cholesterol monitored frequently and be treated with medication.   Ask if you should have a cardiac stress test if your history suggests this. A stress test is a test done on a treadmill that looks for heart disease. This test can find disease prior to there being a problem.  Menopause can be associated with physical symptoms and risks. Hormone replacement therapy is available to decrease these. You should talk to your caregiver about whether starting or continuing to take hormones is right for you.   Osteoporosis is a disease in which the bones lose minerals and strength as we age. This can result in serious bone fractures. Risk of osteoporosis can be identified using a bone density scan. Women ages 70 and over should discuss this with their caregivers, as should women after menopause who have other risk factors. Ask your caregiver whether you should be taking a calcium supplement and Vitamin D, to reduce the rate of osteoporosis.   Avoid drinking alcohol in excess (more than two drinks per day).  Avoid use of street drugs. Do not share needles with anyone. Ask for professional help if you need assistance or instructions on stopping the use of alcohol, cigarettes, and/or drugs.  Brush your teeth twice a day with fluoride toothpaste, and floss once a day. Good oral hygiene prevents tooth decay and gum disease. The problems can be  painful, unattractive, and can cause other health problems. Visit your dentist for a routine oral and dental check up and preventive care every 6-12 months.   Look at your skin regularly.  Use a mirror to look at your back. Notify your caregivers of changes in moles, especially if there are changes in shapes, colors, a size larger than a pencil eraser, an irregular border, or development of new moles.  Safety:  Use seatbelts 100% of the time, whether driving or as a passenger.  Use safety devices such as hearing protection if you work in environments with loud noise or significant background noise.  Use safety glasses when doing any work that could send debris in to the eyes.  Use a helmet if you ride a bike or motorcycle.  Use appropriate safety gear for contact sports.  Talk to your caregiver about gun safety.  Use sunscreen with a SPF (or skin protection factor) of 15 or greater.  Lighter skinned people are at a greater risk of skin cancer. Don't forget to also wear sunglasses in  order to protect your eyes from too much damaging sunlight. Damaging sunlight can accelerate cataract formation.   Practice safe sex. Use condoms. Condoms are used for birth control and to help reduce the spread of sexually transmitted infections (or STIs).  Some of the STIs are gonorrhea (the clap), chlamydia, syphilis, trichomonas, herpes, HPV (human papilloma virus) and HIV (human immunodeficiency virus) which causes AIDS. The herpes, HIV and HPV are viral illnesses that have no cure. These can result in disability, cancer and death.   Keep carbon monoxide and smoke detectors in your home functioning at all times. Change the batteries every 6 months or use a model that plugs into the wall.   Vaccinations:  Stay up to date with your tetanus shots and other required immunizations. You should have a booster for tetanus every 10 years. Be sure to get your flu shot every year, since 5%-20% of the [place] population comes down  with the flu. The flu vaccine changes each year, so being vaccinated once is not enough. Get your shot in the fall, before the flu season peaks.   Other vaccines to consider:  Human Papilloma Virus or HPV causes cancer of the cervix, and other infections that can be transmitted from person to person. There is a vaccine for HPV, and females should get immunized between the ages of 42 and 84. It requires a series of 3 shots.   Pneumococcal vaccine to protect against certain types of pneumonia.  This is normally recommended for adults age 77 or older.  However, adults younger than 44 years old with certain underlying conditions such as diabetes, heart or lung disease should also receive the vaccine.  Shingles vaccine to protect against Varicella Zoster if you are older than age 93, or younger than 44 years old with certain underlying illness.  Hepatitis A vaccine to protect against a form of infection of the liver by a virus acquired from food.  Hepatitis B vaccine to protect against a form of infection of the liver by a virus acquired from blood or body fluids, particularly if you work in health care.  If you plan to travel internationally, check with your local health department for specific vaccination recommendations.  Cancer Screening:  Breast cancer screening is essential to preventive care for women. All women age 2 and older should perform a breast self-exam every month. At age 76 and older, women should have their caregiver complete a breast exam each year. Women at ages 53 and older should have a mammogram (x-ray film) of the breasts. Your caregiver can discuss how often you need mammograms.    Cervical cancer screening includes taking a Pap smear (sample of cells examined under a microscope) from the cervix (end of the uterus). It also includes testing for HPV (Human Papilloma Virus, which can cause cervical cancer). Screening and a pelvic exam should begin at age 42, or 3 years after a  woman becomes sexually active. Screening should occur every year, with a Pap smear but no HPV testing, up to age 59. After age 36, you should have a Pap smear every 3 years with HPV testing, if no HPV was found previously.   Most routine colon cancer screening begins at the age of 72. On a yearly basis, doctors may provide special easy to use take-home tests to check for hidden blood in the stool. Sigmoidoscopy or colonoscopy can detect the earliest forms of colon cancer and is life saving. These tests use a small camera at the end  of a tube to directly examine the colon. Speak to your caregiver about this at age 60, when routine screening begins (and is repeated every 5 years unless early forms of pre-cancerous polyps or small growths are found).

## 2017-03-15 LAB — COMPREHENSIVE METABOLIC PANEL
AG Ratio: 1.9 (calc) (ref 1.0–2.5)
ALT: 28 U/L (ref 6–29)
AST: 21 U/L (ref 10–30)
Albumin: 4.5 g/dL (ref 3.6–5.1)
Alkaline phosphatase (APISO): 53 U/L (ref 33–115)
BUN: 18 mg/dL (ref 7–25)
CO2: 25 mmol/L (ref 20–32)
Calcium: 9.4 mg/dL (ref 8.6–10.2)
Chloride: 104 mmol/L (ref 98–110)
Creat: 0.83 mg/dL (ref 0.50–1.10)
GLUCOSE: 94 mg/dL (ref 65–99)
Globulin: 2.4 g/dL (calc) (ref 1.9–3.7)
Potassium: 4.4 mmol/L (ref 3.5–5.3)
Sodium: 136 mmol/L (ref 135–146)
Total Bilirubin: 0.3 mg/dL (ref 0.2–1.2)
Total Protein: 6.9 g/dL (ref 6.1–8.1)

## 2017-03-15 LAB — TSH: TSH: 2.18 m[IU]/L

## 2017-03-15 LAB — CBC WITH DIFFERENTIAL/PLATELET
BASOS ABS: 86 {cells}/uL (ref 0–200)
Basophils Relative: 1.2 %
EOS PCT: 5.1 %
Eosinophils Absolute: 367 cells/uL (ref 15–500)
HCT: 40.9 % (ref 35.0–45.0)
HEMOGLOBIN: 13.1 g/dL (ref 11.7–15.5)
Lymphs Abs: 1865 cells/uL (ref 850–3900)
MCH: 25.3 pg — AB (ref 27.0–33.0)
MCHC: 32 g/dL (ref 32.0–36.0)
MCV: 79 fL — ABNORMAL LOW (ref 80.0–100.0)
MONOS PCT: 7.8 %
MPV: 10.9 fL (ref 7.5–12.5)
NEUTROS ABS: 4320 {cells}/uL (ref 1500–7800)
Neutrophils Relative %: 60 %
PLATELETS: 280 10*3/uL (ref 140–400)
RBC: 5.18 10*6/uL — ABNORMAL HIGH (ref 3.80–5.10)
RDW: 13.3 % (ref 11.0–15.0)
Total Lymphocyte: 25.9 %
WBC mixed population: 562 cells/uL (ref 200–950)
WBC: 7.2 10*3/uL (ref 3.8–10.8)

## 2017-03-15 LAB — C. TRACHOMATIS/N. GONORRHOEAE RNA
C. TRACHOMATIS RNA, TMA: NOT DETECTED
N. gonorrhoeae RNA, TMA: NOT DETECTED

## 2017-03-15 LAB — RPR: RPR Ser Ql: NONREACTIVE

## 2017-03-15 LAB — HIV ANTIBODY (ROUTINE TESTING W REFLEX): HIV: NONREACTIVE

## 2017-03-30 ENCOUNTER — Ambulatory Visit (HOSPITAL_BASED_OUTPATIENT_CLINIC_OR_DEPARTMENT_OTHER): Payer: 59 | Attending: Family Medicine | Admitting: Internal Medicine

## 2017-03-30 VITALS — Ht 66.0 in | Wt 195.0 lb

## 2017-03-30 DIAGNOSIS — G4719 Other hypersomnia: Secondary | ICD-10-CM

## 2017-03-30 DIAGNOSIS — G4733 Obstructive sleep apnea (adult) (pediatric): Secondary | ICD-10-CM

## 2017-03-30 DIAGNOSIS — Z8669 Personal history of other diseases of the nervous system and sense organs: Secondary | ICD-10-CM

## 2017-04-04 DIAGNOSIS — M9907 Segmental and somatic dysfunction of upper extremity: Secondary | ICD-10-CM | POA: Diagnosis not present

## 2017-04-04 DIAGNOSIS — M25522 Pain in left elbow: Secondary | ICD-10-CM | POA: Diagnosis not present

## 2017-04-04 DIAGNOSIS — M7712 Lateral epicondylitis, left elbow: Secondary | ICD-10-CM | POA: Diagnosis not present

## 2017-04-06 DIAGNOSIS — R87612 Low grade squamous intraepithelial lesion on cytologic smear of cervix (LGSIL): Secondary | ICD-10-CM | POA: Diagnosis not present

## 2017-04-06 DIAGNOSIS — Z1231 Encounter for screening mammogram for malignant neoplasm of breast: Secondary | ICD-10-CM | POA: Diagnosis not present

## 2017-04-06 DIAGNOSIS — Z13228 Encounter for screening for other metabolic disorders: Secondary | ICD-10-CM | POA: Diagnosis not present

## 2017-04-06 DIAGNOSIS — Z01419 Encounter for gynecological examination (general) (routine) without abnormal findings: Secondary | ICD-10-CM | POA: Diagnosis not present

## 2017-04-08 DIAGNOSIS — G4719 Other hypersomnia: Secondary | ICD-10-CM

## 2017-04-08 NOTE — Procedures (Signed)
   Patient Name: Hiliana, Eilts Date: 03/30/2017 Gender: Female D.O.B: 1971/01/17 Age (years): 55 Referring Provider: Girtha Rm NP Height (inches): 54 Interpreting Physician: Baird Lyons MD, ABSM Weight (lbs): 195 RPSGT: Jacolyn Reedy BMI: 31 MRN: [id number] Neck Size: 14.00 CLINICAL INFORMATION Sleep Study Type: HST  Indication for sleep study: Excessive Daytime Sleepiness, Excessive Daytime Sleepiness (780.79)  Epworth Sleepiness Score: 17  SLEEP STUDY TECHNIQUE A multi-channel overnight portable sleep study was performed. The channels recorded were: nasal airflow, thoracic respiratory movement, and oxygen saturation with a pulse oximetry. Snoring was also monitored.  MEDICATIONS Patient self administered medications include: none noted.  SLEEP ARCHITECTURE Patient was studied for 424.5 minutes. The sleep efficiency was 100.0 % and the patient was supine for 88.7%. The arousal index was 0.0 per hour.  RESPIRATORY PARAMETERS The overall AHI was 11.7 per hour, with a central apnea index of 0.0 per hour.  The oxygen nadir was 79% during sleep.  CARDIAC DATA Mean heart rate during sleep was 66.6 bpm.  IMPRESSIONS - Mild obstructive sleep apnea occurred during this study (AHI = 11.7/h). - No significant central sleep apnea occurred during this study (CAI = 0.0/h). - Oxygen desaturation was noted during this study (Min O2 = 79%, Mean 94%). - Patient snored .  DIAGNOSIS - Obstructive Sleep Apnea (327.23 [G47.33 ICD-10])  RECOMMENDATIONS - Therapy for mild OSA depends on symptoms and co-morbidities. Consider CPAP, a fitted oral appliance, or ENT evaluation. - Avoid alcohol, sedatives and other CNS depressants that may worsen sleep apnea and disrupt normal sleep architecture. - Sleep hygiene should be reviewed to assess factors that may improve sleep quality. - Weight management and regular exercise should be initiated or continued.  [Electronically  signed] 04/08/2017 11:21 AM  Baird Lyons MD, ABSM Diplomate, American Board of Sleep Medicine   NPI: [id number] [place], American Board of Sleep Medicine  ELECTRONICALLY SIGNED ON:  04/08/2017, 11:17 AM [place] PH: [phone]   FX: [phone] [place]

## 2017-04-09 DIAGNOSIS — M7712 Lateral epicondylitis, left elbow: Secondary | ICD-10-CM | POA: Diagnosis not present

## 2017-04-09 DIAGNOSIS — M25522 Pain in left elbow: Secondary | ICD-10-CM | POA: Diagnosis not present

## 2017-04-09 DIAGNOSIS — M9907 Segmental and somatic dysfunction of upper extremity: Secondary | ICD-10-CM | POA: Diagnosis not present

## 2017-04-12 ENCOUNTER — Other Ambulatory Visit: Payer: Self-pay | Admitting: Obstetrics and Gynecology

## 2017-04-12 ENCOUNTER — Telehealth: Payer: Self-pay | Admitting: Family Medicine

## 2017-04-12 DIAGNOSIS — R928 Other abnormal and inconclusive findings on diagnostic imaging of breast: Secondary | ICD-10-CM

## 2017-04-12 NOTE — Telephone Encounter (Signed)
Christy Adams sent a mychart message to her on 10/14 about scheduling an appointment to go over sleep study results. Called and left message for pt to call back schedule an appt

## 2017-04-12 NOTE — Telephone Encounter (Signed)
Pt left message requesting her results from her sleep study, please call

## 2017-04-13 DIAGNOSIS — M9907 Segmental and somatic dysfunction of upper extremity: Secondary | ICD-10-CM | POA: Diagnosis not present

## 2017-04-13 DIAGNOSIS — M7712 Lateral epicondylitis, left elbow: Secondary | ICD-10-CM | POA: Diagnosis not present

## 2017-04-13 DIAGNOSIS — M25522 Pain in left elbow: Secondary | ICD-10-CM | POA: Diagnosis not present

## 2017-04-16 ENCOUNTER — Ambulatory Visit (INDEPENDENT_AMBULATORY_CARE_PROVIDER_SITE_OTHER): Payer: 59 | Admitting: Family Medicine

## 2017-04-16 ENCOUNTER — Encounter: Payer: Self-pay | Admitting: Family Medicine

## 2017-04-16 VITALS — BP 120/78 | HR 77 | Wt 203.2 lb

## 2017-04-16 DIAGNOSIS — G4733 Obstructive sleep apnea (adult) (pediatric): Secondary | ICD-10-CM

## 2017-04-16 DIAGNOSIS — M7712 Lateral epicondylitis, left elbow: Secondary | ICD-10-CM

## 2017-04-16 NOTE — Patient Instructions (Signed)
Take 2 Aleve twice daily with food. If your symptoms are not getting better or if you get worse let me know.   You should receive a phone call from [facility] regarding your CPAP.     Tennis Elbow Tennis elbow (lateral epicondylitis) is inflammation of the outer tendons of your forearm close to your elbow. Your tendons attach your muscles to your bones. The outer tendons of your forearm are used to extend your wrist, and they attach on the outside part of your elbow. Tennis elbow is often found in people who play tennis, but anyone may get the condition from repeatedly extending the wrist or turning the forearm. What are the causes? This condition is caused by repeatedly extending your wrist and using your hands. It can result from sports or work that requires repetitive forearm movements. Tennis elbow may also be caused by an injury. What increases the risk? You have a higher risk of developing tennis elbow if you play tennis or another racquet sport. You also have a higher risk if you frequently use your hands for work. This condition is also more likely to develop in:  Musicians.  Carpenters, painters, and plumbers.  Cooks.  Cashiers.  People who work in [employer].  Architect workers.  Butchers.  People who use computers.  What are the signs or symptoms? Symptoms of this condition include:  Pain and tenderness in your forearm and the outer part of your elbow. You may only feel the pain when you use your arm, or you may feel it even when you are not using your arm.  A burning feeling that runs from your elbow through your arm.  Weak grip in your hands.  How is this diagnosed? This condition may be diagnosed by medical history and physical exam. You may also have other tests, including:  X-rays.  MRI.  How is this treated? Your health care provider will recommend lifestyle adjustments, such as resting and icing your arm. Treatment may also include:  Medicines for  inflammation. This may include shots of cortisone if your pain continues.  Physical therapy. This may include massage or exercises.  An elbow brace.  Surgery may eventually be recommended if your pain does not go away with treatment. Follow these instructions at home: Activity  Rest your elbow and wrist as directed by your health care provider. Try to avoid any activities that caused the problem until your health care provider says that you can do them again.  If a physical therapist teaches you exercises, do all of them as directed.  If you lift an object, lift it with your palm facing upward. This lowers the stress on your elbow. Lifestyle  If your tennis elbow is caused by sports, check your equipment and make sure that: ? You are using it correctly. ? It is the best fit for you.  If your tennis elbow is caused by work, take breaks frequently, if you are able. Talk with your manager about how to best perform tasks in a way that is safe. ? If your tennis elbow is caused by computer use, talk with your manager about any changes that can be made to your work environment. General instructions  If directed, apply ice to the painful area: ? Put ice in a plastic bag. ? Place a towel between your skin and the bag. ? Leave the ice on for 20 minutes, 2-3 times per day.  Take medicines only as directed by your health care provider.  If you  were given a brace, wear it as directed by your health care provider.  Keep all follow-up visits as directed by your health care provider. This is important. Contact a health care provider if:  Your pain does not get better with treatment.  Your pain gets worse.  You have numbness or weakness in your forearm, hand, or fingers. This information is not intended to replace advice given to you by your health care provider. Make sure you discuss any questions you have with your health care provider. Document Released: 06/12/2005 Document Revised:  02/10/2016 Document Reviewed: 06/08/2014 Elsevier Interactive Patient Education  [employer].

## 2017-04-16 NOTE — Progress Notes (Signed)
   Subjective:    Patient ID: Christy Adams, female    DOB: 29-Oct-1970, 46 y.o.   MRN: [id number]  HPI Chief Complaint  Patient presents with  . discuss sleep study    discuss sleep study,    She is here to discuss abnormal sleep study. She was found to have mild OSA. She would like to start using a CPAP.   She also has complaints of left elbow pain for the past 3 weeks. Pain is dull constantly with occasional radiation into her forearm. Pain is worse with certain movements. States she has had tennis elbow in the past and this feels the same. No known injury. No numbness, tingling or weakness. No redness, warmth or swelling.   She has tried 600 mg of ibuprofen sporadically over the past 3 weeks and no improvement.   Denies fever, chills. No other joint or muscle aches.   Reviewed allergies, medications, past medical, surgical, and social history.   Review of Systems Pertinent positives and negatives in the history of present illness.     Objective:   Physical Exam BP 120/78   Pulse 77   Wt 203 lb 3.2 oz (92.2 kg)   BMI 32.80 kg/m   LUE is neurovascularly intact.  Tenderness just below lateral epicondyle. Pain with supination of her left forearm and resisted gripping. No erythema, edema, effusion, or warmth. Normal ROM and strength.       Assessment & Plan:  OSA (obstructive sleep apnea)  Lateral epicondylitis of left elbow  Discussed using good sleep hygiene. She would like to pursue CPAP for now.  I will initiate [facility] for her CPAP and supplies.  Recommend conservative treatment for her elbow for now. Will have her switch to 2 Aleve twice daily with food and use ice or heat. She will avoid movements that flare symptoms for now. She will let me know if her symptoms are not improving or if they are worsening.

## 2017-04-17 ENCOUNTER — Ambulatory Visit
Admission: RE | Admit: 2017-04-17 | Discharge: 2017-04-17 | Disposition: A | Discharge status: Home / Self Care | Payer: 59 | Source: Ambulatory Visit | Attending: Obstetrics and Gynecology | Admitting: Obstetrics and Gynecology

## 2017-04-17 DIAGNOSIS — N6001 Solitary cyst of right breast: Secondary | ICD-10-CM | POA: Diagnosis not present

## 2017-04-17 DIAGNOSIS — R928 Other abnormal and inconclusive findings on diagnostic imaging of breast: Secondary | ICD-10-CM | POA: Diagnosis not present

## 2017-04-19 DIAGNOSIS — H5000 Unspecified esotropia: Secondary | ICD-10-CM | POA: Diagnosis not present

## 2017-04-19 DIAGNOSIS — H5213 Myopia, bilateral: Secondary | ICD-10-CM | POA: Diagnosis not present

## 2017-04-19 DIAGNOSIS — H04123 Dry eye syndrome of bilateral lacrimal glands: Secondary | ICD-10-CM | POA: Diagnosis not present

## 2017-04-20 DIAGNOSIS — M25522 Pain in left elbow: Secondary | ICD-10-CM | POA: Diagnosis not present

## 2017-04-20 DIAGNOSIS — M9907 Segmental and somatic dysfunction of upper extremity: Secondary | ICD-10-CM | POA: Diagnosis not present

## 2017-04-20 DIAGNOSIS — M7712 Lateral epicondylitis, left elbow: Secondary | ICD-10-CM | POA: Diagnosis not present

## 2017-05-21 DIAGNOSIS — R87619 Unspecified abnormal cytological findings in specimens from cervix uteri: Secondary | ICD-10-CM | POA: Diagnosis not present

## 2017-05-21 DIAGNOSIS — N879 Dysplasia of cervix uteri, unspecified: Secondary | ICD-10-CM | POA: Diagnosis not present

## 2017-05-21 HISTORY — PX: COLPOSCOPY: SHX161

## 2017-06-12 DIAGNOSIS — H5005 Alternating esotropia: Secondary | ICD-10-CM | POA: Diagnosis not present

## 2017-06-29 DIAGNOSIS — G4733 Obstructive sleep apnea (adult) (pediatric): Secondary | ICD-10-CM | POA: Diagnosis not present

## 2017-07-06 ENCOUNTER — Telehealth: Payer: Self-pay | Admitting: Family Medicine

## 2017-07-06 ENCOUNTER — Other Ambulatory Visit: Payer: Self-pay | Admitting: Family Medicine

## 2017-07-06 DIAGNOSIS — Z299 Encounter for prophylactic measures, unspecified: Secondary | ICD-10-CM

## 2017-07-06 MED ORDER — OSELTAMIVIR PHOSPHATE 75 MG PO CAPS: 75.0000 mg | ORAL_CAPSULE | Freq: Once | ORAL | Status: DC

## 2017-07-06 NOTE — Telephone Encounter (Signed)
Pt called her son and daughter have flu A.  Pt going out of town Sunday and wants to get the Tamiflu.  Please advise pt [phone] and advise if you will give her this.

## 2017-07-06 NOTE — Telephone Encounter (Signed)
Pt called she will pick up Tamiflu and take at first symptom.  Tamiflu to [facility] [address].

## 2017-07-06 NOTE — Telephone Encounter (Signed)
If she has been exposed within the past 48 hours then I am ok with her getting this. If it has been longer than 48 hours then she should not take this.  Tamiflu 75 mg once daily x 7 days. Thank you

## 2017-07-23 DIAGNOSIS — H5005 Alternating esotropia: Secondary | ICD-10-CM | POA: Diagnosis not present

## 2017-07-27 ENCOUNTER — Encounter (HOSPITAL_BASED_OUTPATIENT_CLINIC_OR_DEPARTMENT_OTHER): Payer: Self-pay | Admitting: *Deleted

## 2017-07-30 DIAGNOSIS — G4733 Obstructive sleep apnea (adult) (pediatric): Secondary | ICD-10-CM | POA: Diagnosis not present

## 2017-07-31 ENCOUNTER — Ambulatory Visit: Payer: Self-pay | Admitting: Ophthalmology

## 2017-08-02 ENCOUNTER — Ambulatory Visit (INDEPENDENT_AMBULATORY_CARE_PROVIDER_SITE_OTHER): Payer: 59 | Admitting: Family Medicine

## 2017-08-02 ENCOUNTER — Ambulatory Visit: Payer: Self-pay | Admitting: Ophthalmology

## 2017-08-02 ENCOUNTER — Encounter: Payer: Self-pay | Admitting: Family Medicine

## 2017-08-02 VITALS — BP 112/70 | HR 64 | Temp 99.1°F | Ht 66.25 in | Wt 200.8 lb

## 2017-08-02 DIAGNOSIS — H8111 Benign paroxysmal vertigo, right ear: Secondary | ICD-10-CM

## 2017-08-02 NOTE — Progress Notes (Signed)
Chief Complaint  Patient presents with  . Dizziness    when she leans back, looks down. When she tried to overly focus on something she said her eyes actually go up and down. Feels like she is spinning. Needsnote sent to Dr. Edwena Blow ([place]) after visit as she was scheduled for eye surgery tomorrow and is now scheduled for 09/26/17.   2 mornings ago she did yoga. Had no problem during class, but after getting up from shivasana (laying flat) she immediately had onset of vertigo, and vomiting. She felt a little weird after, slightly queasy, but managed to sit and type at work. When she was home that evening, she tried a maneuver that she saw on video that triggered the vertigo badly. Spun only slowly getting out of bed this morning. No vomiting yesterday, mostly stayed flat (stayed home). Doesn't think she feels dizzy when looking side to side, just up and down.  She tried meclizine 25mg  took it just once helped, but recurred later. Bottle said it should last 24 hrs, so didn't take another dose.  No URi symptoms or ear complaints--no tinnitus, hearing loss, ear pain or fullness.  She is due to have eye muscle surgery, but it was postponed to April. Asking if this is okay.  Advised postponing is good (due to nystagmus), should be fine then.  PMH, PSH, SH reviewed  Outpatient Encounter Medications as of 08/02/2017  Medication Sig  . busPIRone (BUSPAR) 15 MG tablet Take 15 mg by mouth 2 (two) times daily.   . sertraline (ZOLOFT) 100 MG tablet Take 200 mg by mouth daily.  Marland Kitchen buPROPion (WELLBUTRIN SR) 150 MG 12 hr tablet Take 150 mg by mouth daily.   No facility-administered encounter medications on file as of 08/02/2017.    No Known Allergies  ROS: no fever, chills, headache, URI symptoms, ear pain, cough, shortness of breath, diarrhea.  +vertigo and vomiting (none today). No chest pain, palpitations, shortness of breath, edema or other complaints.  PHYSICAL  EXAM: BP 110/70   Pulse 68   Temp 99.1 F (37.3 C) (Tympanic)   Ht 5' 6.25" (1.683 m)   Wt 200 lb 12.8 oz (91.1 kg)   BMI 32.17 kg/m   Pleasant female, somewhat anxious, somewhat uncomfortable, queasy HEENT: PERRL, EOMI, conjunctiva and sclera clear. Fundi benign. TM's and EAC's normal. Nasal mucosa is mild-mod edematous, no erythema or purulence. Sinuses are nontender. OP is clear. Neck: no lymphadenopathy, thyromegaly or mass Heart: regular rate and rhythm, no murmur Lungs: clear bilaterally Neuro: alert and oriented, cranial nerves intact. Normal strength. DTR's 2+ and symmetric. Normal finger to nose, no pronator drift.  Vertigo only very short-lived and mild when laying down with head to right--worse symptoms (with mild nystagmus) when she sat up (head turned to the right).  Very light symptoms when sitting up with head straight, and no problems at all with her head turned to the left.  ASSESSMENT/PLAN:  Benign paroxysmal positional vertigo of right ear  Ddx, normal course reviewed Trial of meclizine, avoiding triggering position. If not improving in the next couple of days, or if not getting relief from meclizine, refer to PT (vs trying Epley at home). Risks/side effects of meclizine reviewed.  Pt stated she felt bad at checkout--hadn't eaten anything, still felt queasy. She was given crackers, granola bar and 12.5mg  meclizine, and felt better.  30 min visit.  Pt with anxiety, many questions.  More than 1/2 visit spent counseling, answering questions

## 2017-08-02 NOTE — Patient Instructions (Signed)
Use the meclizine (25mg )--I think this is what you have already. You may use it every 8 hours as needed for vertigo.  It may make you drowsy, so use with caution if driving.  If this doesn't improve on its own, and/or the medication isn't helpful, let us know and we can try and refer you for physical therapy.  epley maneuver is a good treatment, usually done by the therapist.    Benign Positional Vertigo Vertigo is the feeling that you or your surroundings are moving when they are not. Benign positional vertigo is the most common form of vertigo. The cause of this condition is not serious (is benign). This condition is triggered by certain movements and positions (is positional). This condition can be dangerous if it occurs while you are doing something that could endanger you or others, such as driving. What are the causes? In many cases, the cause of this condition is not known. It may be caused by a disturbance in an area of the inner ear that helps your brain to sense movement and balance. This disturbance can be caused by a viral infection (labyrinthitis), head injury, or repetitive motion. What increases the risk? This condition is more likely to develop in:  Women.  People who are 45 years of age or older.  What are the signs or symptoms? Symptoms of this condition usually happen when you move your head or your eyes in different directions. Symptoms may start suddenly, and they usually last for less than a minute. Symptoms may include:  Loss of balance and falling.  Feeling like you are spinning or moving.  Feeling like your surroundings are spinning or moving.  Nausea and vomiting.  Blurred vision.  Dizziness.  Involuntary eye movement (nystagmus).  Symptoms can be mild and cause only slight annoyance, or they can be severe and interfere with daily life. Episodes of benign positional vertigo may return (recur) over time, and they may be triggered by certain movements.  Symptoms may improve over time. How is this diagnosed? This condition is usually diagnosed by medical history and a physical exam of the head, neck, and ears. You may be referred to a health care provider who specializes in ear, nose, and throat (ENT) problems (otolaryngologist) or a provider who specializes in disorders of the nervous system (neurologist). You may have additional testing, including:  MRI.  A CT scan.  Eye movement tests. Your health care provider may ask you to change positions quickly while he or she watches you for symptoms of benign positional vertigo, such as nystagmus. Eye movement may be tested with an electronystagmogram (ENG), caloric stimulation, the Dix-Hallpike test, or the roll test.  An electroencephalogram (EEG). This records electrical activity in your brain.  Hearing tests.  How is this treated? Usually, your health care provider will treat this by moving your head in specific positions to adjust your inner ear back to normal. Surgery may be needed in severe cases, but this is rare. In some cases, benign positional vertigo may resolve on its own in 2-4 weeks. Follow these instructions at home: Safety  Move slowly.Avoid sudden body or head movements.  Avoid driving.  Avoid operating heavy machinery.  Avoid doing any tasks that would be dangerous to you or others if a vertigo episode would occur.  If you have trouble walking or keeping your balance, try using a cane for stability. If you feel dizzy or unstable, sit down right away.  Return to your normal activities as told by  your health care provider. Ask your health care provider what activities are safe for you. General instructions  Take over-the-counter and prescription medicines only as told by your health care provider.  Avoid certain positions or movements as told by your health care provider.  Drink enough fluid to keep your urine clear or pale yellow.  Keep all follow-up visits as told  by your health care provider. This is important. Contact a health care provider if:  You have a fever.  Your condition gets worse or you develop new symptoms.  Your family or friends notice any behavioral changes.  Your nausea or vomiting gets worse.  You have numbness or a "pins and needles" sensation. Get help right away if:  You have difficulty speaking or moving.  You are always dizzy.  You faint.  You develop severe headaches.  You have weakness in your legs or arms.  You have changes in your hearing or vision.  You develop a stiff neck.  You develop sensitivity to light. This information is not intended to replace advice given to you by your health care provider. Make sure you discuss any questions you have with your health care provider. Document Released: 03/20/2006 Document Revised: 11/18/2015 Document Reviewed: 10/05/2014 Elsevier Interactive Patient Education  [employer].

## 2017-08-03 ENCOUNTER — Ambulatory Visit (HOSPITAL_BASED_OUTPATIENT_CLINIC_OR_DEPARTMENT_OTHER): Admission: RE | Admit: 2017-08-03 | Payer: 59 | Source: Ambulatory Visit | Admitting: Ophthalmology

## 2017-08-03 ENCOUNTER — Other Ambulatory Visit: Payer: Self-pay | Admitting: Ophthalmology

## 2017-08-03 HISTORY — DX: Nausea with vomiting, unspecified: R11.2

## 2017-08-03 HISTORY — DX: Other specified postprocedural states: Z98.890

## 2017-08-03 HISTORY — DX: Sleep apnea, unspecified: G47.30

## 2017-08-03 SURGERY — STRABISMUS SURGERY, BILATERAL
Anesthesia: General | Laterality: Bilateral

## 2017-09-17 DIAGNOSIS — H5005 Alternating esotropia: Secondary | ICD-10-CM | POA: Diagnosis not present

## 2017-09-26 DIAGNOSIS — H5005 Alternating esotropia: Secondary | ICD-10-CM | POA: Diagnosis not present

## 2017-10-30 IMAGING — MG 2D DIGITAL DIAGNOSTIC UNILATERAL RIGHT MAMMOGRAM WITH CAD AND AD
6 series · 6 of 14 positions shown · non-contrast
Comparison: Previous exam(s).

CLINICAL DATA: Screening recall for possible right breast mass.

EXAM:
2D DIGITAL DIAGNOSTIC UNILATERAL RIGHT MAMMOGRAM WITH CAD AND
ADJUNCT TOMO
RIGHT BREAST ULTRASOUND

[R MLO]
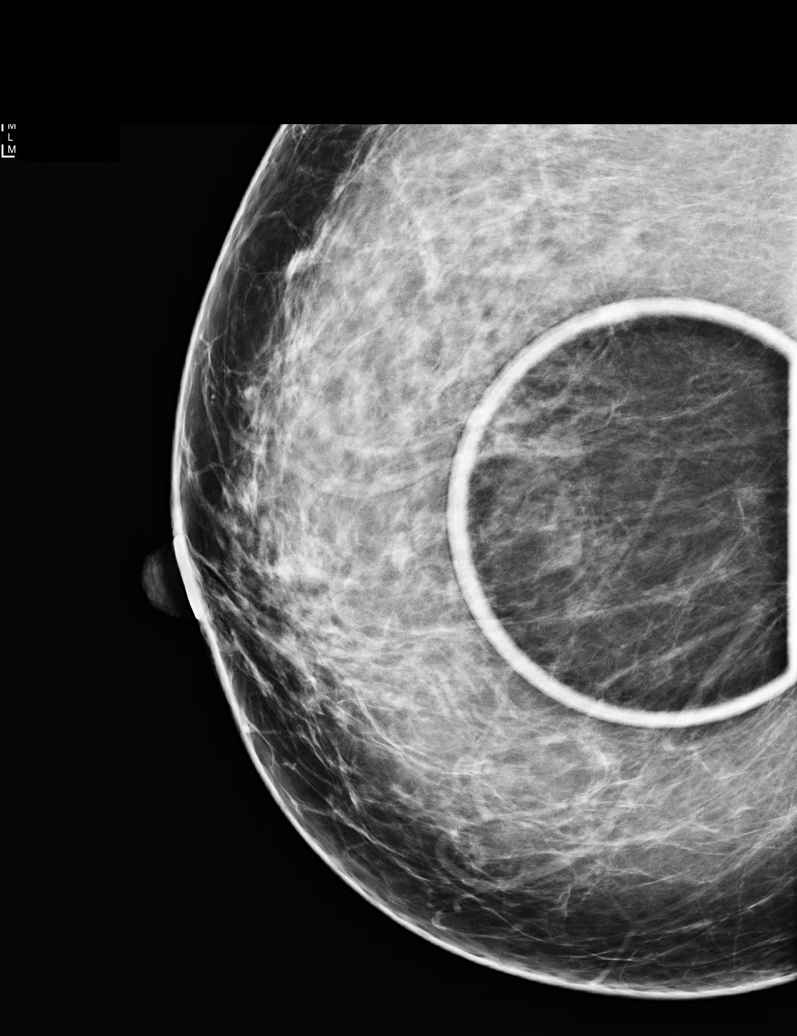

[R CC]
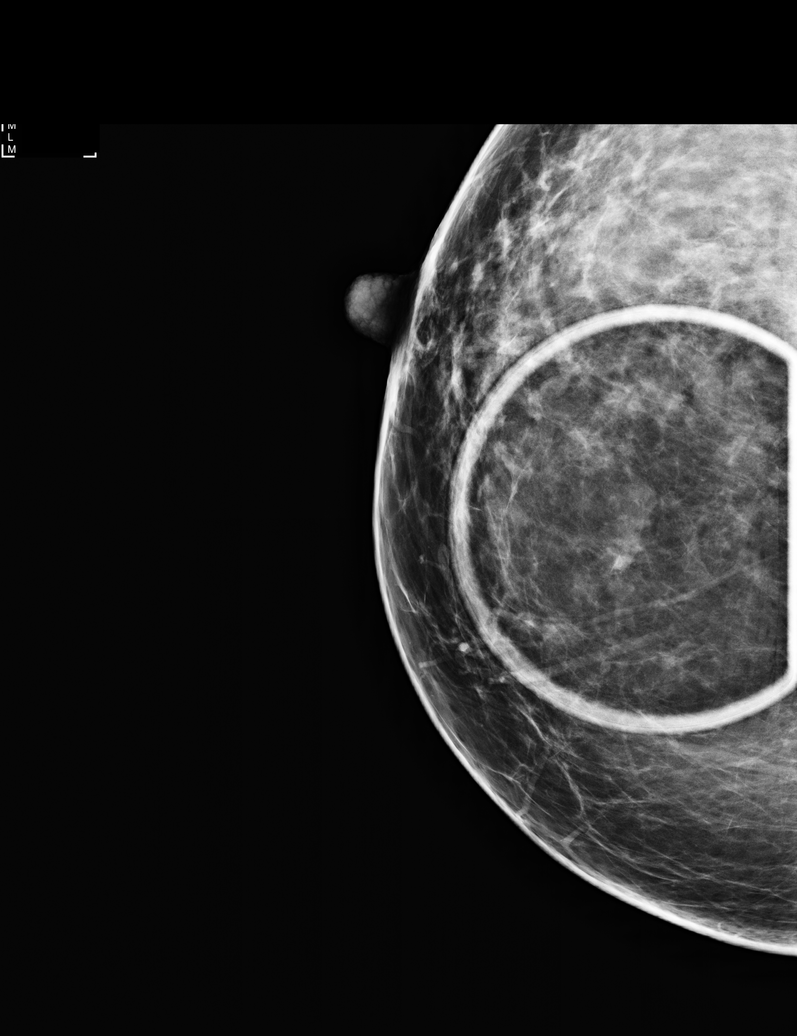

[R MLO synth-2D]
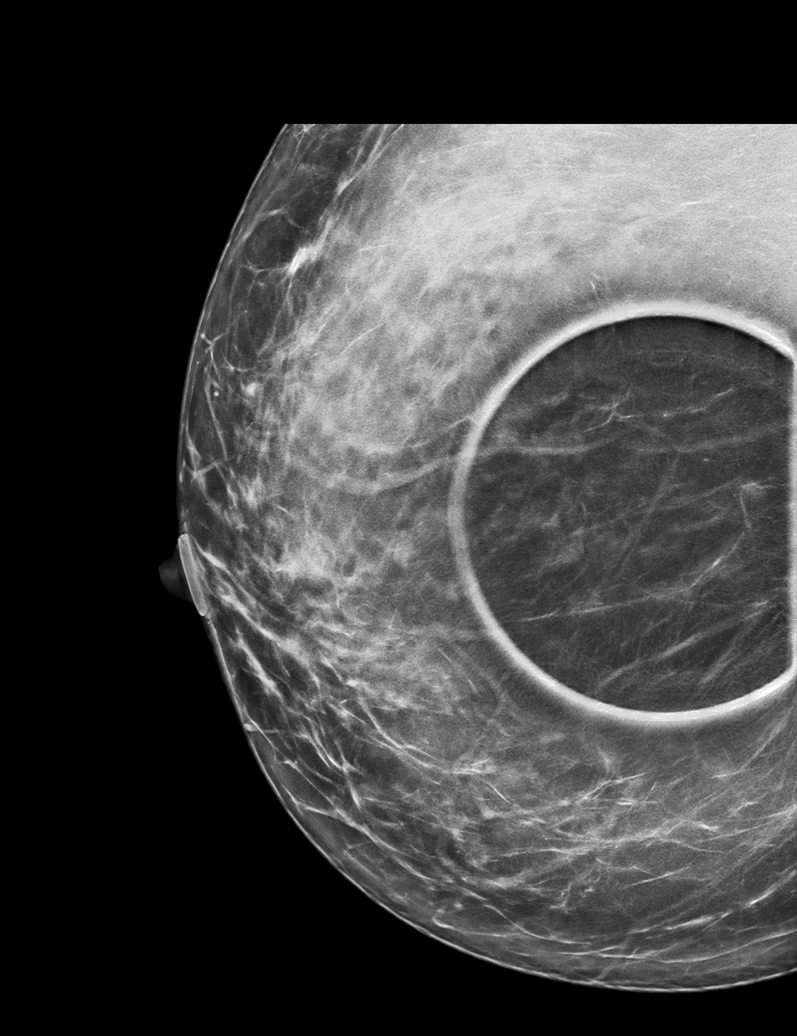

[R CC synth-2D]
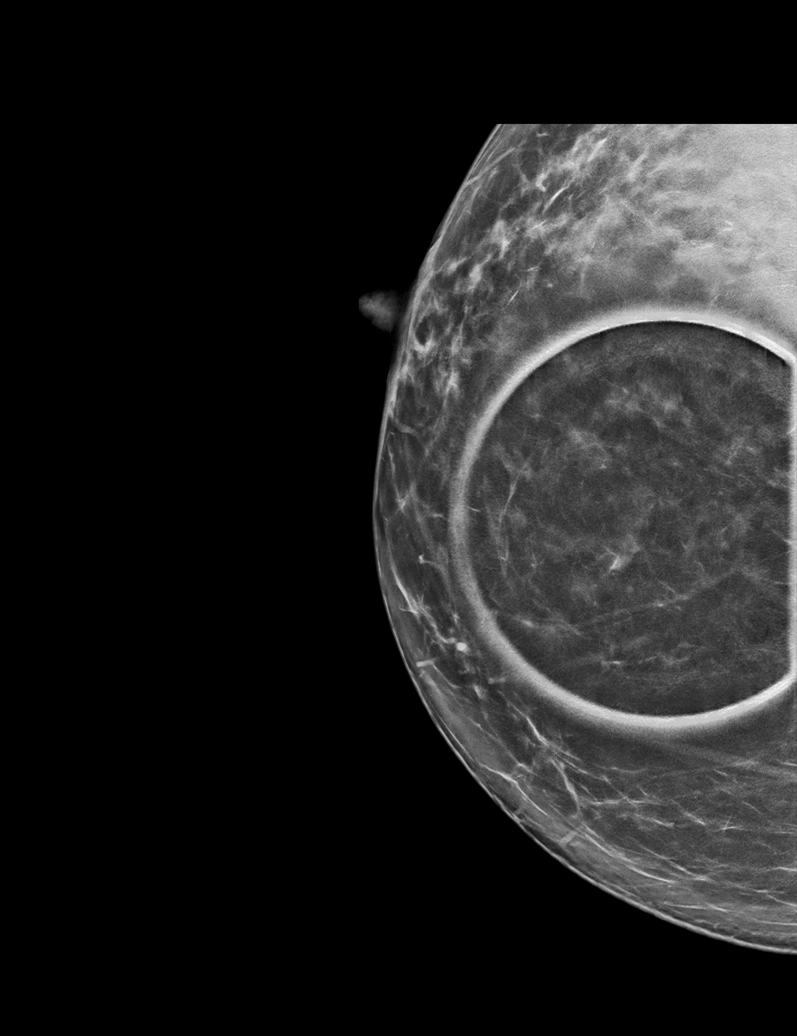

[R CC tomo · tomo slice 41/80.0]
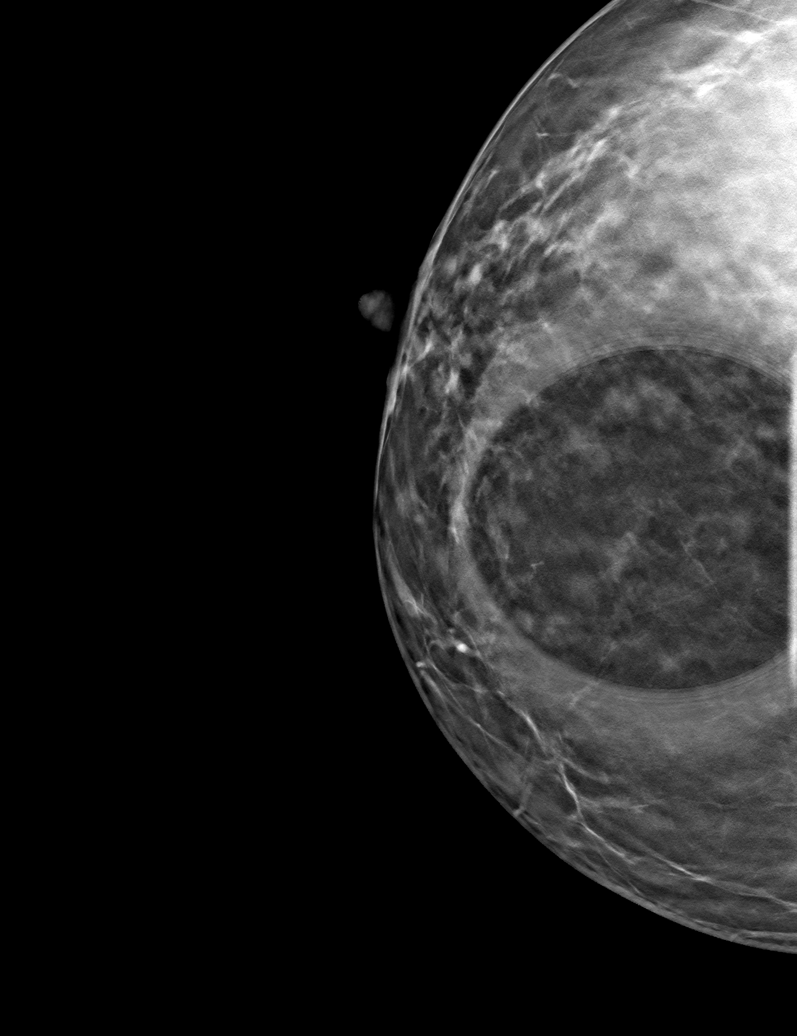

[R MLO tomo · tomo slice 41/80.0]
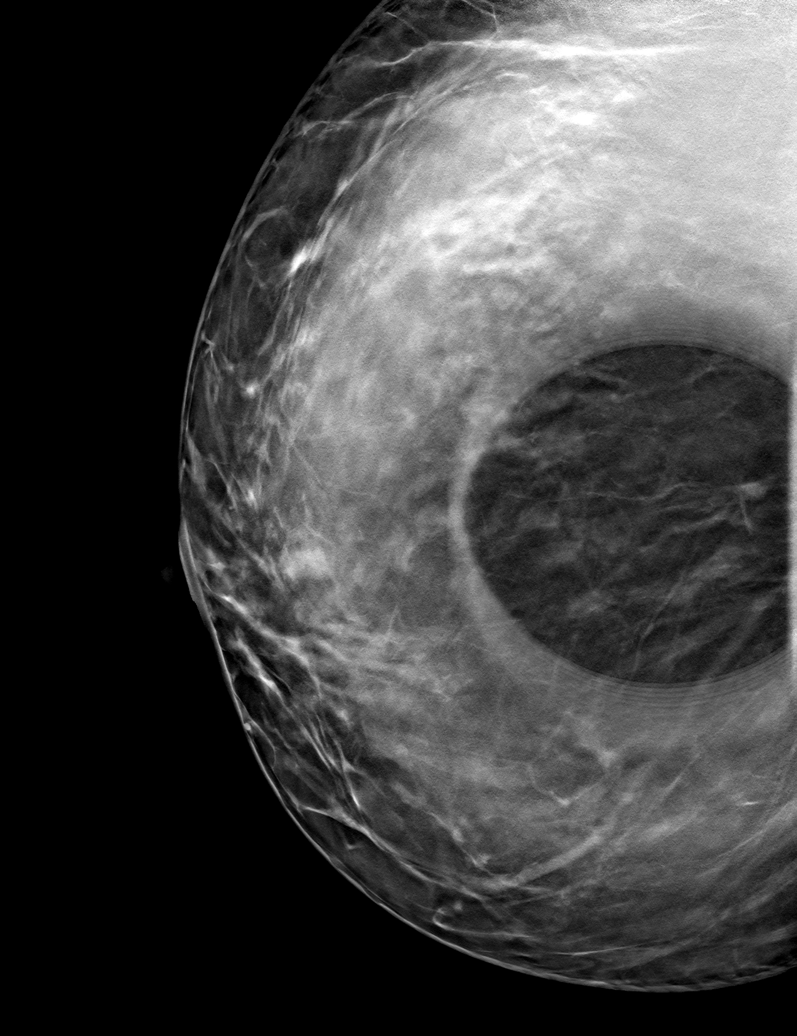

[6 of 14 positions shown; findings below may reference images not displayed]

ACR Breast Density Category b: There are scattered areas of
fibroglandular density.
FINDINGS: Spot compression CC and MLO tomograms were performed of the slightly
inner right breast demonstrating an oval circumscribed mass in the
slightly inner right breast measuring approximately 5 mm.

Mammographic images were processed with CAD.

Physical examination of the inner right breast does not reveal any
palpable masses.

Targeted ultrasound of the right breast was performed demonstrating
several small cysts, with a cluster of cysts at 2 o'clock 1 cm from
the nipple measuring 0.6 x 0.3 x 0.4 cm. This likely corresponds
with mammography findings.
IMPRESSION: Right breast cysts. There are no findings of malignancy in the right
breast.

RECOMMENDATION:
Screening mammogram in one year.(Code:7U-S-YUW)

I have discussed the findings and recommendations with the patient.
Results were also provided in writing at the conclusion of the
visit. If applicable, a reminder letter will be sent to the patient
regarding the next appointment.

BI-RADS CATEGORY  2: Benign.

## 2017-10-30 IMAGING — US ULTRASOUND RIGHT BREAST LIMITED
1 series · 6 of 6 positions shown · non-contrast
Comparison: Previous exam(s).

CLINICAL DATA: Screening recall for possible right breast mass.

EXAM:
2D DIGITAL DIAGNOSTIC UNILATERAL RIGHT MAMMOGRAM WITH CAD AND
ADJUNCT TOMO
RIGHT BREAST ULTRASOUND

[Series 1: ultrasound right breast limited · 0.07mm/px · 6 of 6 slices shown]
[im 1/6]
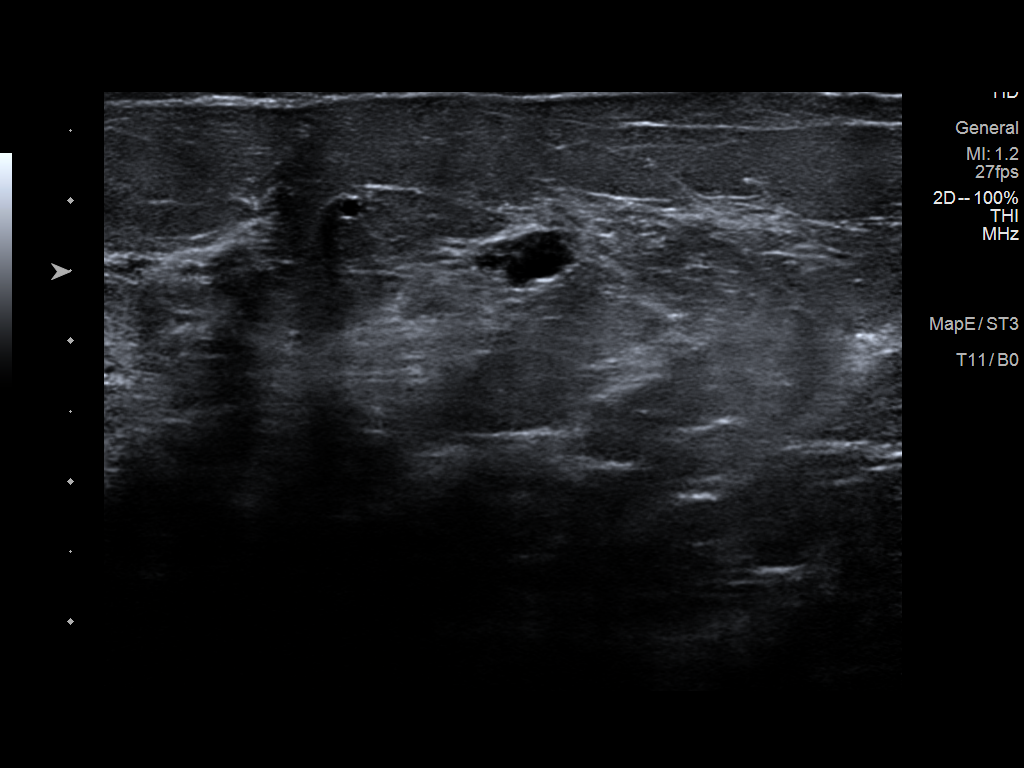
[im 2/6]
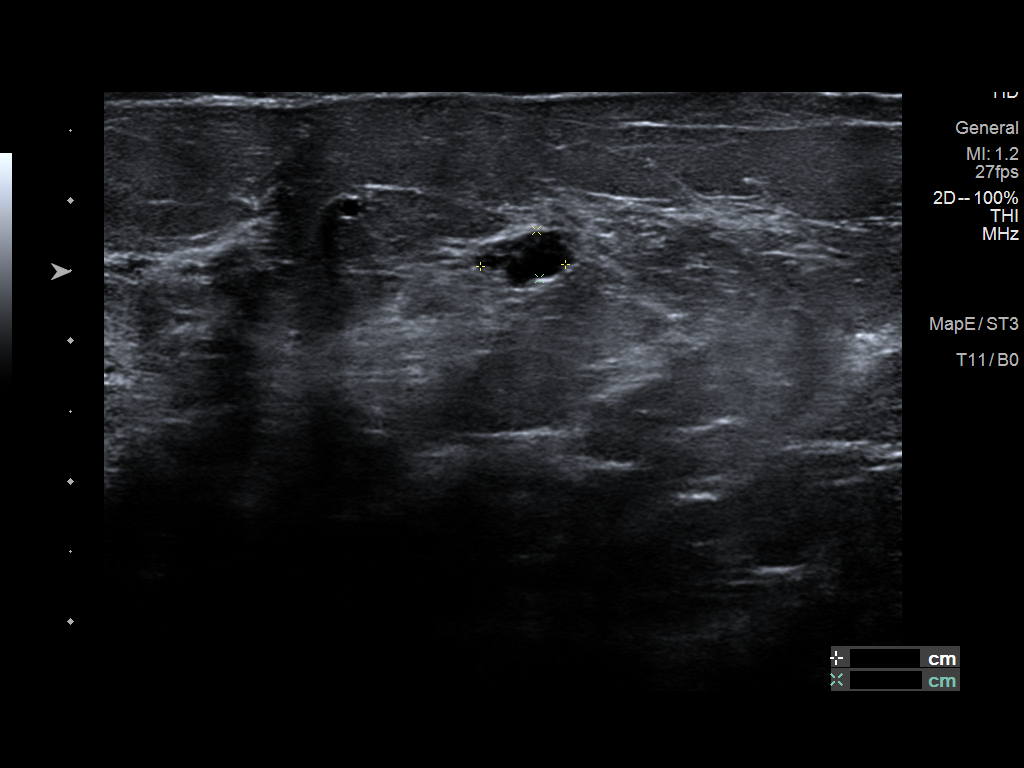
[im 3/6]
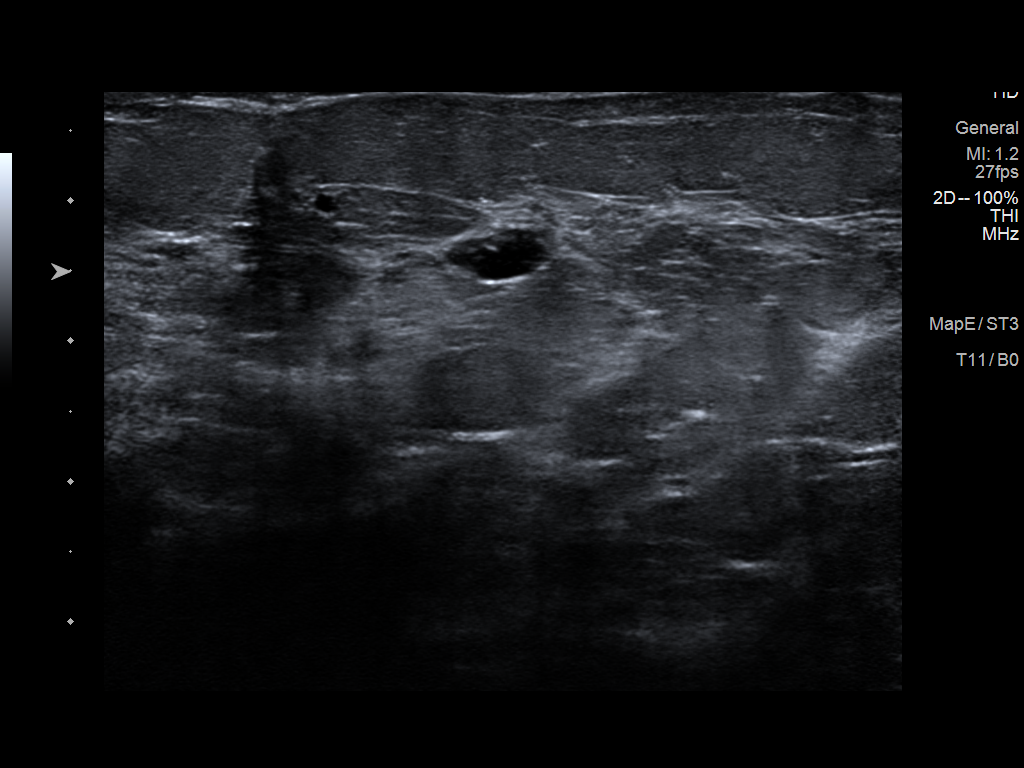
[im 4/6]
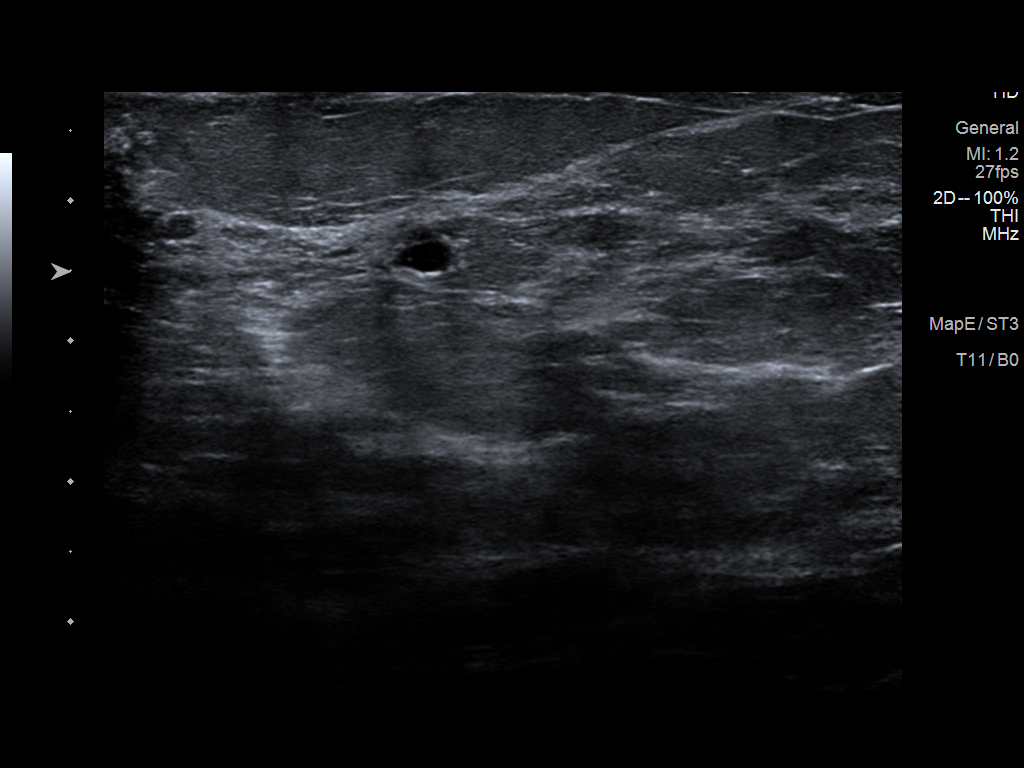
[im 5/6]
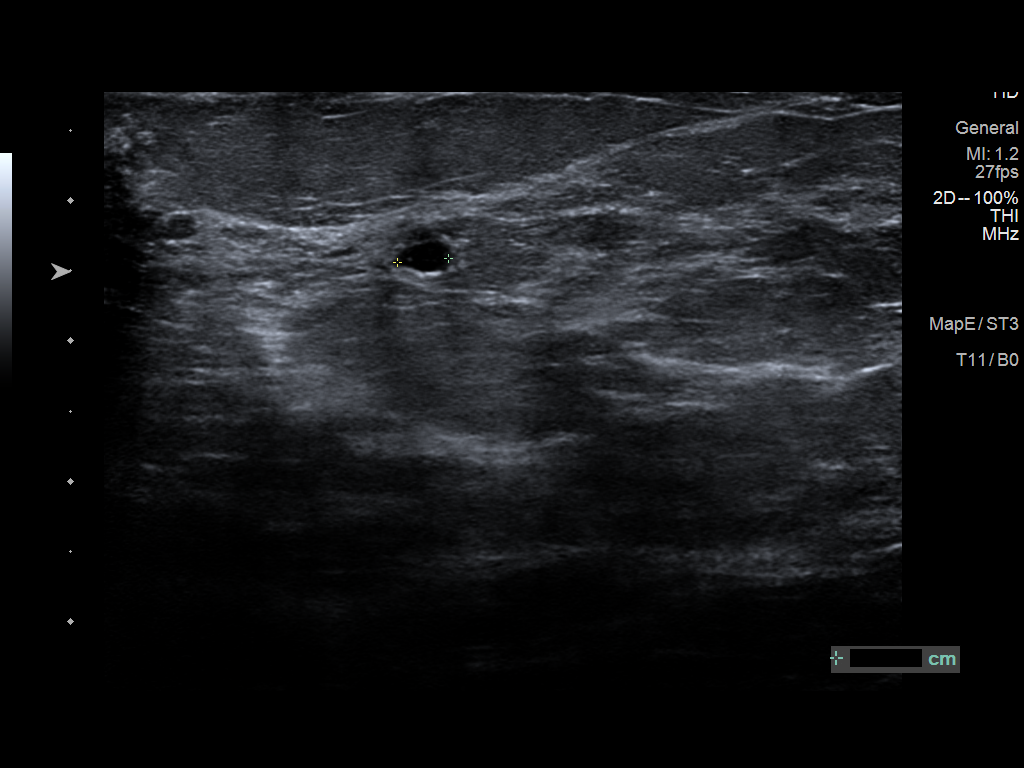
[im 6/6]
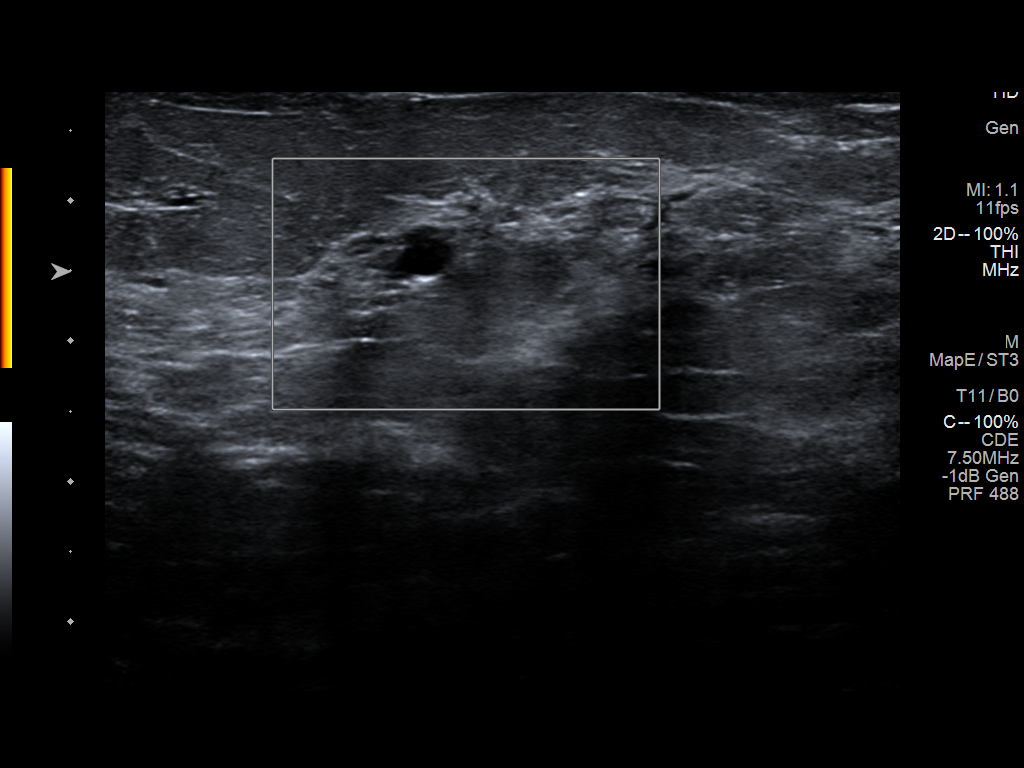

[6 of 6 positions shown; findings below may reference images not displayed]

ACR Breast Density Category b: There are scattered areas of
fibroglandular density.
FINDINGS: Spot compression CC and MLO tomograms were performed of the slightly
inner right breast demonstrating an oval circumscribed mass in the
slightly inner right breast measuring approximately 5 mm.

Mammographic images were processed with CAD.

Physical examination of the inner right breast does not reveal any
palpable masses.

Targeted ultrasound of the right breast was performed demonstrating
several small cysts, with a cluster of cysts at 2 o'clock 1 cm from
the nipple measuring 0.6 x 0.3 x 0.4 cm. This likely corresponds
with mammography findings.
IMPRESSION: Right breast cysts. There are no findings of malignancy in the right
breast.

RECOMMENDATION:
Screening mammogram in one year.(Code:7U-S-YUW)

I have discussed the findings and recommendations with the patient.
Results were also provided in writing at the conclusion of the
visit. If applicable, a reminder letter will be sent to the patient
regarding the next appointment.

BI-RADS CATEGORY  2: Benign.

## 2018-02-18 ENCOUNTER — Ambulatory Visit: Payer: 59 | Admitting: Family Medicine

## 2018-04-06 DIAGNOSIS — Z23 Encounter for immunization: Secondary | ICD-10-CM | POA: Diagnosis not present

## 2018-04-12 DIAGNOSIS — Z01419 Encounter for gynecological examination (general) (routine) without abnormal findings: Secondary | ICD-10-CM | POA: Diagnosis not present

## 2018-04-12 DIAGNOSIS — Z130 Encounter for screening for diseases of the blood and blood-forming organs and certain disorders involving the immune mechanism: Secondary | ICD-10-CM | POA: Diagnosis not present

## 2018-04-12 DIAGNOSIS — R87612 Low grade squamous intraepithelial lesion on cytologic smear of cervix (LGSIL): Secondary | ICD-10-CM | POA: Diagnosis not present

## 2018-04-12 DIAGNOSIS — Z1231 Encounter for screening mammogram for malignant neoplasm of breast: Secondary | ICD-10-CM | POA: Diagnosis not present

## 2018-05-16 DIAGNOSIS — H5005 Alternating esotropia: Secondary | ICD-10-CM | POA: Diagnosis not present

## 2018-05-27 DIAGNOSIS — N879 Dysplasia of cervix uteri, unspecified: Secondary | ICD-10-CM | POA: Diagnosis not present

## 2018-05-27 DIAGNOSIS — N72 Inflammatory disease of cervix uteri: Secondary | ICD-10-CM | POA: Diagnosis not present

## 2018-06-03 ENCOUNTER — Telehealth: Payer: Self-pay | Admitting: Psychiatry

## 2018-06-03 NOTE — Telephone Encounter (Signed)
Faxed over this morning

## 2018-06-03 NOTE — Telephone Encounter (Signed)
Pt called stating pharmacy should be sending RF request on Sertraline to [place] but she needs it filled. They gave her 3 pills for over the weekend because she ran out. [facility] [address]

## 2018-07-15 DIAGNOSIS — M25571 Pain in right ankle and joints of right foot: Secondary | ICD-10-CM | POA: Diagnosis not present

## 2018-08-02 ENCOUNTER — Ambulatory Visit: Payer: Self-pay | Admitting: Psychiatry

## 2018-08-20 ENCOUNTER — Encounter: Payer: Self-pay | Admitting: Psychiatry

## 2018-08-20 ENCOUNTER — Ambulatory Visit: Payer: 59 | Admitting: Psychiatry

## 2018-08-20 DIAGNOSIS — F4001 Agoraphobia with panic disorder: Secondary | ICD-10-CM

## 2018-08-20 DIAGNOSIS — F4310 Post-traumatic stress disorder, unspecified: Secondary | ICD-10-CM

## 2018-08-20 DIAGNOSIS — F3342 Major depressive disorder, recurrent, in full remission: Secondary | ICD-10-CM | POA: Diagnosis not present

## 2018-08-20 MED ORDER — SERTRALINE HCL 100 MG PO TABS: 200.0000 mg | ORAL_TABLET | Freq: Every day | ORAL | 1 refills | Status: DC

## 2018-08-20 MED ORDER — BUPROPION HCL ER (XL) 300 MG PO TB24: 300.0000 mg | ORAL_TABLET | Freq: Every day | ORAL | 1 refills | Status: DC

## 2018-08-20 MED ORDER — BUSPIRONE HCL 30 MG PO TABS: 30.0000 mg | ORAL_TABLET | Freq: Two times a day (BID) | ORAL | 1 refills | Status: DC

## 2018-08-20 NOTE — Progress Notes (Signed)
MYSTIQUE BJELLAND [id number] [birth year] 48 y.o.  Subjective:   Patient ID:  Christy Adams is a 48 y.o. (DOB 10/15/1970) female.  Chief Complaint:  Chief Complaint  Patient presents with  . Follow-up    Mediation Management   Last seen August. HPI Christy Adams presents to the office today for follow-up of anxiety and depression.  Was forgetting 2nd Buspar and takes it all at once and has not had a problem with the reduction to Buspirone 30 to HS.  Some anxiety over a new job plan but that's normal and not paralyzing.  Also a relief that Tom, Ex, is doing better.  Also will have better health insurance.  2nd interview yesterday and looks hopeful.  Gershon Mussel had been very ill for months.    Patient reports stable mood and denies depressed or irritable moods.  Patient denies any recent difficulty with anxiety.  Patient denies difficulty with sleep initiation or maintenance. Denies appetite disturbance.  Patient reports that energy and motivation have been good.  Patient denies any difficulty with concentration.  Patient denies any suicidal ideation.  Review of Systems:  Review of Systems  Neurological: Negative for tremors and weakness.  Psychiatric/Behavioral: Negative for agitation, behavioral problems, confusion, decreased concentration, dysphoric mood, hallucinations, self-injury, sleep disturbance and suicidal ideas. The patient is not nervous/anxious and is not hyperactive.     Medications: I have reviewed the patient's current medications.  Current Outpatient Medications  Medication Sig Dispense Refill  . buPROPion (WELLBUTRIN XL) 300 MG 24 hr tablet Take 1 tablet (300 mg total) by mouth daily. 90 tablet 1  . busPIRone (BUSPAR) 30 MG tablet Take 1 tablet (30 mg total) by mouth 2 (two) times daily. 180 tablet 1  . sertraline (ZOLOFT) 100 MG tablet Take 2 tablets (200 mg total) by mouth daily. 180 tablet 1   No current facility-administered medications for this visit.      Medication Side Effects: None  Allergies: No Known Allergies  Past Medical History:  Diagnosis Date  . Abnormal Pap smear of cervix   . Allergy   . Anemia   . Anxiety   . Asthma    allergic to dust - only uses inhaler prn - rarely  . Depression   . Headache    otc med prn - rare  . HPV in female   . Panic attacks   . PONV (postoperative nausea and vomiting)   . Sleep apnea    Using  CPAP  . SVD (spontaneous vaginal delivery)    x 3    Family History  Problem Relation Age of Onset  . Heart disease Mother   . Depression Mother   . Hypertension Mother   . Thyroid disease Mother   . Heart attack Father 82  . Heart failure Maternal Grandmother   . Diabetes Maternal Grandfather   . Heart attack Maternal Grandfather   . Psoriasis Sister     Social History   Socioeconomic History  . Marital status: Divorced    Spouse name: Not on file  . Number of children: Not on file  . Years of education: Not on file  . Highest education level: Not on file  Occupational History  . Not on file  Social Needs  . Financial resource strain: Not on file  . Food insecurity:    Worry: Not on file    Inability: Not on file  . Transportation needs:    Medical: Not on file    Non-medical: Not  on file  Tobacco Use  . Smoking status: Never Smoker  . Smokeless tobacco: Never Used  Substance and Sexual Activity  . Alcohol use: Yes    Comment: wine at night 2-3 days per week.   . Drug use: No  . Sexual activity: Not Currently    Partners: Male    Birth control/protection: None    Comment: divorced   Lifestyle  . Physical activity:    Days per week: Not on file    Minutes per session: Not on file  . Stress: Not on file  Relationships  . Social connections:    Talks on phone: Not on file    Gets together: Not on file    Attends religious service: Not on file    Active member of club or organization: Not on file    Attends meetings of clubs or organizations: Not on file     Relationship status: Not on file  . Intimate partner violence:    Fear of current or ex partner: Not on file    Emotionally abused: Not on file    Physically abused: Not on file    Forced sexual activity: Not on file  Other Topics Concern  . Not on file  Social History Narrative  . Not on file    Past Medical History, Surgical history, Social history, and Family history were reviewed and updated as appropriate.   Please see review of systems for further details on the patient's review from today.   Objective:   Physical Exam:  There were no vitals taken for this visit.  Physical Exam Constitutional:      General: She is not in acute distress.    Appearance: She is well-developed.  Musculoskeletal:        General: No deformity.  Neurological:     Mental Status: She is alert and oriented to person, place, and time.     Coordination: Coordination normal.  Psychiatric:        Attention and Perception: Attention normal. She is attentive.        Mood and Affect: Mood normal. Mood is not anxious or depressed. Affect is not labile, blunt, angry or inappropriate.        Speech: Speech normal.        Behavior: Behavior normal.        Thought Content: Thought content normal. Thought content does not include homicidal or suicidal ideation. Thought content does not include homicidal or suicidal plan.        Cognition and Memory: Cognition normal.        Judgment: Judgment normal.     Comments: Insight is good. Situational anxiety.    Lab Review:     Component Value Date/Time   NA 136 03/14/2017 1042   K 4.4 03/14/2017 1042   CL 104 03/14/2017 1042   CO2 25 03/14/2017 1042   GLUCOSE 94 03/14/2017 1042   BUN 18 03/14/2017 1042   CREATININE 0.83 03/14/2017 1042   CALCIUM 9.4 03/14/2017 1042   PROT 6.9 03/14/2017 1042   AST 21 03/14/2017 1042   ALT 28 03/14/2017 1042   BILITOT 0.3 03/14/2017 1042       Component Value Date/Time   WBC 7.2 03/14/2017 1042   RBC 5.18 (H)  03/14/2017 1042   HGB 13.1 03/14/2017 1042   HCT 40.9 03/14/2017 1042   PLT 280 03/14/2017 1042   MCV 79.0 (L) 03/14/2017 1042   MCH 25.3 (L) 03/14/2017 1042   MCHC  32.0 03/14/2017 1042   RDW 13.3 03/14/2017 1042   LYMPHSABS 1,865 03/14/2017 1042   EOSABS 367 03/14/2017 1042   BASOSABS 86 03/14/2017 1042    No results found for: POCLITH, LITHIUM   No results found for: PHENYTOIN, PHENOBARB, VALPROATE, CBMZ   .res Asse2ssment: Plan:    PTSD (post-traumatic stress disorder)  Panic disorder with agoraphobia  Recurrent major depression in full remission ([place])  Diagnosis Posttraumatic stress disorder Panic disorder with agoraphobia Major depression recurrent under control  Supportive therapy dealing with ex husband's severe mental health problems with which she is heavily involved in support both emotionally and financially.  Fortunately he is significantly improved.  Also around the stress of a job change.  No indication for med change  Follow-up 4 months  Lynder Parents, MD, DFAPA  Please see After Visit Summary for patient specific instructions.  Future Appointments  Date Time Provider [place]  02/25/2019  9:00 AM Cottle, Billey Co., MD [facility] None    No orders of the defined types were placed in this encounter.     -------------------------------

## 2018-08-26 ENCOUNTER — Encounter: Payer: Self-pay | Admitting: Psychiatry

## 2019-02-21 ENCOUNTER — Other Ambulatory Visit: Payer: Self-pay

## 2019-02-21 DIAGNOSIS — Z20822 Contact with and (suspected) exposure to covid-19: Secondary | ICD-10-CM

## 2019-02-21 NOTE — Addendum Note (Signed)
Addended by: Mendel Ryder on: 02/21/2019 01:55 PM   Modules accepted: Orders

## 2019-02-23 LAB — NOVEL CORONAVIRUS, NAA: SARS-CoV-2, NAA: NOT DETECTED

## 2019-02-24 ENCOUNTER — Other Ambulatory Visit: Payer: Self-pay

## 2019-02-24 DIAGNOSIS — Z20822 Contact with and (suspected) exposure to covid-19: Secondary | ICD-10-CM

## 2019-02-25 ENCOUNTER — Other Ambulatory Visit: Payer: Self-pay

## 2019-02-25 ENCOUNTER — Ambulatory Visit (INDEPENDENT_AMBULATORY_CARE_PROVIDER_SITE_OTHER): Payer: 59 | Admitting: Psychiatry

## 2019-02-25 ENCOUNTER — Encounter: Payer: Self-pay | Admitting: Psychiatry

## 2019-02-25 DIAGNOSIS — F4310 Post-traumatic stress disorder, unspecified: Secondary | ICD-10-CM | POA: Diagnosis not present

## 2019-02-25 DIAGNOSIS — F4001 Agoraphobia with panic disorder: Secondary | ICD-10-CM

## 2019-02-25 DIAGNOSIS — F3342 Major depressive disorder, recurrent, in full remission: Secondary | ICD-10-CM

## 2019-02-25 LAB — NOVEL CORONAVIRUS, NAA: SARS-CoV-2, NAA: NOT DETECTED

## 2019-02-25 NOTE — Progress Notes (Signed)
EVENY PYNES [id number] 1971-04-30 48 y.o.   Virtual Visit via Telephone Note  I connected with pt by telephone and verified that I am speaking with the correct person using two identifiers.   I discussed the limitations, risks, security and privacy concerns of performing an evaluation and management service by telephone and the availability of in person appointments. I also discussed with the patient that there may be a patient responsible charge related to this service. The patient expressed understanding and agreed to proceed.  I discussed the assessment and treatment plan with the patient. The patient was provided an opportunity to ask questions and all were answered. The patient agreed with the plan and demonstrated an understanding of the instructions.   The patient was advised to call back or seek an in-person evaluation if the symptoms worsen or if the condition fails to improve as anticipated.  I provided 15 minutes of non-face-to-face time during this encounter. The call started at 915 and ended at 930. The patient was located at home and the provider was located office.   Subjective:   Patient ID:  Christy Adams is a 48 y.o. (DOB [birth year]) female.  Chief Complaint:  Chief Complaint  Patient presents with  . Follow-up    Medication Management  . Other    PTSD    HPI Christy Adams presents to the office today for follow-up of anxiety and depression.  Last visit August 20, 2018.  No meds were changed.  Son may have Covid.  Does have strep.    Doing really well overall.  Lots of stress and change with move and new job.  I'm feeling great.  Was forgetting 2nd Buspar and takes it all at once and has not had a problem with the reduction to Buspirone 30 to HS.  Some anxiety over a new job plan but that's normal and not paralyzing.  Also a relief that Christy Adams, Ex, is doing better.  Also will have better health insurance.  2nd interview yesterday and looks hopeful.  Christy Adams  had been very ill for months.    Patient reports stable mood and denies depressed or irritable moods.  Patient denies any recent difficulty with anxiety.  Patient denies difficulty with sleep initiation or maintenance. Denies appetite disturbance.  Patient reports that energy and motivation have been good.  Patient denies any difficulty with concentration.  Patient denies any suicidal ideation.  Past Psychiatric Medication Trials: Sertraline 200, Wellbutrin XL 300, buspirone 30 twice daily, clonazepam, lorazepam, fluoxetine no response  Review of Systems:  Review of Systems  Respiratory: Negative for shortness of breath.   Neurological: Negative for tremors and weakness.  Psychiatric/Behavioral: Negative for agitation, behavioral problems, confusion, decreased concentration, dysphoric mood, hallucinations, self-injury, sleep disturbance and suicidal ideas. The patient is not nervous/anxious and is not hyperactive.     Medications: I have reviewed the patient's current medications.  Current Outpatient Medications  Medication Sig Dispense Refill  . buPROPion (WELLBUTRIN XL) 300 MG 24 hr tablet Take 1 tablet (300 mg total) by mouth daily. 90 tablet 1  . busPIRone (BUSPAR) 30 MG tablet Take 1 tablet (30 mg total) by mouth 2 (two) times daily. (Patient taking differently: Take 30 mg by mouth at bedtime. ) 180 tablet 1  . montelukast (SINGULAIR) 10 MG tablet TK 1 T PO QHS    . sertraline (ZOLOFT) 100 MG tablet Take 2 tablets (200 mg total) by mouth daily. 180 tablet 1   No current facility-administered medications for  this visit.     Medication Side Effects: None  Allergies: No Known Allergies  Past Medical History:  Diagnosis Date  . Abnormal Pap smear of cervix   . Allergy   . Anemia   . Anxiety   . Asthma    allergic to dust - only uses inhaler prn - rarely  . Depression   . Headache    otc med prn - rare  . HPV in female   . Panic attacks   . PONV (postoperative nausea and  vomiting)   . Sleep apnea    Using  CPAP  . SVD (spontaneous vaginal delivery)    x 3    Family History  Problem Relation Age of Onset  . Heart disease Mother   . Depression Mother   . Hypertension Mother   . Thyroid disease Mother   . Heart attack Father 60  . Heart failure Maternal Grandmother   . Diabetes Maternal Grandfather   . Heart attack Maternal Grandfather   . Psoriasis Sister     Social History   Socioeconomic History  . Marital status: Divorced    Spouse name: Not on file  . Number of children: Not on file  . Years of education: Not on file  . Highest education level: Not on file  Occupational History  . Not on file  Social Needs  . Financial resource strain: Not on file  . Food insecurity    Worry: Not on file    Inability: Not on file  . Transportation needs    Medical: Not on file    Non-medical: Not on file  Tobacco Use  . Smoking status: Never Smoker  . Smokeless tobacco: Never Used  Substance and Sexual Activity  . Alcohol use: Yes    Comment: wine at night 2-3 days per week.   . Drug use: No  . Sexual activity: Not Currently    Partners: Male    Birth control/protection: None    Comment: divorced   Lifestyle  . Physical activity    Days per week: Not on file    Minutes per session: Not on file  . Stress: Not on file  Relationships  . Social Herbalist on phone: Not on file    Gets together: Not on file    Attends religious service: Not on file    Active member of club or organization: Not on file    Attends meetings of clubs or organizations: Not on file    Relationship status: Not on file  . Intimate partner violence    Fear of current or ex partner: Not on file    Emotionally abused: Not on file    Physically abused: Not on file    Forced sexual activity: Not on file  Other Topics Concern  . Not on file  Social History Narrative  . Not on file    Past Medical History, Surgical history, Social history, and Family  history were reviewed and updated as appropriate.   Please see review of systems for further details on the patient's review from today.   Objective:   Physical Exam:  There were no vitals taken for this visit.  Physical Exam Neurological:     Mental Status: She is alert and oriented to person, place, and time.     Cranial Nerves: No dysarthria.  Psychiatric:        Attention and Perception: Attention normal.        Mood  and Affect: Mood normal.        Speech: Speech normal.        Behavior: Behavior is cooperative.        Thought Content: Thought content normal. Thought content is not paranoid or delusional. Thought content does not include homicidal or suicidal ideation. Thought content does not include homicidal or suicidal plan.        Cognition and Memory: Cognition and memory normal.        Judgment: Judgment normal.    Lab Review:     Component Value Date/Time   NA 136 03/14/2017 1042   K 4.4 03/14/2017 1042   CL 104 03/14/2017 1042   CO2 25 03/14/2017 1042   GLUCOSE 94 03/14/2017 1042   BUN 18 03/14/2017 1042   CREATININE 0.83 03/14/2017 1042   CALCIUM 9.4 03/14/2017 1042   PROT 6.9 03/14/2017 1042   AST 21 03/14/2017 1042   ALT 28 03/14/2017 1042   BILITOT 0.3 03/14/2017 1042       Component Value Date/Time   WBC 7.2 03/14/2017 1042   RBC 5.18 (H) 03/14/2017 1042   HGB 13.1 03/14/2017 1042   HCT 40.9 03/14/2017 1042   PLT 280 03/14/2017 1042   MCV 79.0 (L) 03/14/2017 1042   MCH 25.3 (L) 03/14/2017 1042   MCHC 32.0 03/14/2017 1042   RDW 13.3 03/14/2017 1042   LYMPHSABS 1,865 03/14/2017 1042   EOSABS 367 03/14/2017 1042   BASOSABS 86 03/14/2017 1042    No results found for: POCLITH, LITHIUM   No results found for: PHENYTOIN, PHENOBARB, VALPROATE, CBMZ   .res Asse2ssment: Plan:    PTSD (post-traumatic stress disorder)  Panic disorder with agoraphobia  Recurrent major depression in full remission ([place])  History of ADD  Good response to  meds.  Answered questions about getting a new PCP.  Also discussed issues with her sister with.  Supportive therapy dealing with sisters severe migraine headache problems and kidney problems along with her mental health issues including anxiety.  Offered problem solving around achieving new physicians because she is moved to this area from another state.  Discussed stress management techniques such as relaxation exercises as well as physical exercise helping with her own stress and worry over her sister and her sister's health.  No indication for med change Continue sertraline for PTSD and an major depression and panic at 200 mg daily Continue buspirone 30 mg twice daily for PTSD and augmentation for depression Continue Wellbutrin XL 300 mg a.m. for depression and a history of ADD symptoms  We are not using stimulants at this time for ADD because of their tendency to make her anxious.  Follow-up 6 months  Lynder Parents, MD, DFAPA  Please see After Visit Summary for patient specific instructions.  No future appointments.  No orders of the defined types were placed in this encounter.     -------------------------------

## 2019-02-26 ENCOUNTER — Other Ambulatory Visit: Payer: Self-pay

## 2019-02-26 DIAGNOSIS — Z20822 Contact with and (suspected) exposure to covid-19: Secondary | ICD-10-CM

## 2019-02-28 LAB — NOVEL CORONAVIRUS, NAA: SARS-CoV-2, NAA: NOT DETECTED

## 2019-03-04 ENCOUNTER — Other Ambulatory Visit: Payer: Self-pay | Admitting: Psychiatry

## 2019-04-09 ENCOUNTER — Other Ambulatory Visit: Payer: Self-pay

## 2019-04-09 MED ORDER — BUPROPION HCL ER (XL) 300 MG PO TB24: 300.0000 mg | ORAL_TABLET | Freq: Every day | ORAL | 1 refills | Status: DC

## 2019-05-07 ENCOUNTER — Other Ambulatory Visit: Payer: Self-pay

## 2019-05-07 DIAGNOSIS — Z20822 Contact with and (suspected) exposure to covid-19: Secondary | ICD-10-CM

## 2019-05-09 LAB — NOVEL CORONAVIRUS, NAA: SARS-CoV-2, NAA: NOT DETECTED

## 2019-06-17 ENCOUNTER — Other Ambulatory Visit: Payer: Self-pay | Admitting: Psychiatry

## 2019-06-18 ENCOUNTER — Ambulatory Visit: Payer: 59 | Attending: Internal Medicine

## 2019-06-18 DIAGNOSIS — U071 COVID-19: Secondary | ICD-10-CM

## 2019-06-18 DIAGNOSIS — R238 Other skin changes: Secondary | ICD-10-CM

## 2019-06-19 LAB — NOVEL CORONAVIRUS, NAA: SARS-CoV-2, NAA: NOT DETECTED

## 2019-07-01 ENCOUNTER — Ambulatory Visit: Payer: 59 | Attending: Internal Medicine

## 2019-07-01 DIAGNOSIS — Z20822 Contact with and (suspected) exposure to covid-19: Secondary | ICD-10-CM

## 2019-07-03 LAB — NOVEL CORONAVIRUS, NAA: SARS-CoV-2, NAA: NOT DETECTED

## 2019-09-05 ENCOUNTER — Ambulatory Visit: Payer: 59

## 2019-09-20 ENCOUNTER — Other Ambulatory Visit: Payer: Self-pay | Admitting: Psychiatry

## 2019-10-23 ENCOUNTER — Other Ambulatory Visit: Payer: Self-pay | Admitting: Psychiatry

## 2019-10-25 ENCOUNTER — Other Ambulatory Visit: Payer: Self-pay | Admitting: Psychiatry

## 2019-12-01 ENCOUNTER — Other Ambulatory Visit: Payer: Self-pay

## 2019-12-01 ENCOUNTER — Encounter (INDEPENDENT_AMBULATORY_CARE_PROVIDER_SITE_OTHER): Payer: 59 | Admitting: Ophthalmology

## 2019-12-01 DIAGNOSIS — H43813 Vitreous degeneration, bilateral: Secondary | ICD-10-CM | POA: Diagnosis not present

## 2019-12-01 DIAGNOSIS — H2512 Age-related nuclear cataract, left eye: Secondary | ICD-10-CM | POA: Diagnosis not present

## 2019-12-01 DIAGNOSIS — H5213 Myopia, bilateral: Secondary | ICD-10-CM | POA: Diagnosis not present

## 2020-01-28 ENCOUNTER — Other Ambulatory Visit: Payer: Self-pay | Admitting: Psychiatry

## 2020-03-24 ENCOUNTER — Other Ambulatory Visit: Payer: Self-pay | Admitting: Psychiatry

## 2020-03-24 NOTE — Telephone Encounter (Signed)
Please get patient scheduled with CC

## 2020-03-24 NOTE — Telephone Encounter (Signed)
review 

## 2020-03-24 NOTE — Telephone Encounter (Signed)
Patient last seen 09/20. Did not f/u.

## 2020-03-24 NOTE — Telephone Encounter (Signed)
Please get patient scheduled with Dr. Clovis Pu.

## 2020-03-26 ENCOUNTER — Other Ambulatory Visit: Payer: Self-pay | Admitting: Psychiatry

## 2020-03-26 NOTE — Telephone Encounter (Signed)
Please review

## 2020-05-10 ENCOUNTER — Other Ambulatory Visit: Payer: Self-pay | Admitting: Psychiatry

## 2020-05-18 ENCOUNTER — Other Ambulatory Visit: Payer: Self-pay | Admitting: Psychiatry

## 2020-05-19 NOTE — Telephone Encounter (Signed)
Call to RS

## 2020-06-19 ENCOUNTER — Other Ambulatory Visit: Payer: Self-pay | Admitting: Psychiatry

## 2020-06-21 NOTE — Telephone Encounter (Signed)
Call to RS

## 2020-06-22 NOTE — Telephone Encounter (Signed)
Pt scheduled for 07/01/20.

## 2020-07-01 ENCOUNTER — Other Ambulatory Visit: Payer: Self-pay | Admitting: Psychiatry

## 2020-07-01 ENCOUNTER — Telehealth: Payer: Self-pay | Admitting: Psychiatry

## 2020-07-01 ENCOUNTER — Ambulatory Visit: Payer: 59 | Admitting: Psychiatry

## 2020-07-01 MED ORDER — SERTRALINE HCL 100 MG PO TABS: 200.0000 mg | ORAL_TABLET | Freq: Every day | ORAL | 0 refills | Status: DC

## 2020-07-01 NOTE — Telephone Encounter (Signed)
Pt requesting a refill on the Zoloft. She requests a 90 day supply instead on 30 due to cost. Pt appt is scheduled for 2/21 a RS from 1/6 due to provider out.

## 2020-07-12 ENCOUNTER — Other Ambulatory Visit: Payer: Self-pay

## 2020-07-12 MED ORDER — SERTRALINE HCL 100 MG PO TABS: 200.0000 mg | ORAL_TABLET | Freq: Every day | ORAL | 0 refills | Status: DC

## 2020-07-29 ENCOUNTER — Other Ambulatory Visit: Payer: Self-pay | Admitting: Psychiatry

## 2020-08-16 ENCOUNTER — Telehealth (INDEPENDENT_AMBULATORY_CARE_PROVIDER_SITE_OTHER): Payer: 59 | Admitting: Psychiatry

## 2020-08-16 ENCOUNTER — Encounter: Payer: Self-pay | Admitting: Psychiatry

## 2020-08-16 DIAGNOSIS — F4310 Post-traumatic stress disorder, unspecified: Secondary | ICD-10-CM | POA: Diagnosis not present

## 2020-08-16 DIAGNOSIS — F4001 Agoraphobia with panic disorder: Secondary | ICD-10-CM

## 2020-08-16 DIAGNOSIS — F3342 Major depressive disorder, recurrent, in full remission: Secondary | ICD-10-CM

## 2020-08-16 MED ORDER — BUSPIRONE HCL 30 MG PO TABS: 30.0000 mg | ORAL_TABLET | Freq: Every day | ORAL | 1 refills | Status: DC

## 2020-08-16 MED ORDER — SERTRALINE HCL 100 MG PO TABS
200.0000 mg | ORAL_TABLET | Freq: Every day | ORAL | 1 refills | Status: DC
Start: 2020-08-16 — End: 2021-07-12

## 2020-08-16 MED ORDER — BUPROPION HCL ER (XL) 300 MG PO TB24: 300.0000 mg | ORAL_TABLET | Freq: Every day | ORAL | 1 refills | Status: DC

## 2020-08-16 NOTE — Progress Notes (Addendum)
Christy Adams [id number] Jan 16, 1971 50 y.o.   Video Visit via My Chart  I connected with pt by My Chart and verified that I am speaking with the correct person using two identifiers.   I discussed the limitations, risks, security and privacy concerns of performing an evaluation and management service by My Chart  and the availability of in person appointments. I also discussed with the patient that there may be a patient responsible charge related to this service. The patient expressed understanding and agreed to proceed.  I discussed the assessment and treatment plan with the patient. The patient was provided an opportunity to ask questions and all were answered. The patient agreed with the plan and demonstrated an understanding of the instructions.   The patient was advised to call back or seek an in-person evaluation if the symptoms worsen or if the condition fails to improve as anticipated.  I provided 30 minutes of video time during this encounter.  The patient was located at home and the provider was located office. Session started 1115 until 1145.  Subjective:   Patient ID:  Christy Adams is a 50 y.o. (DOB 1971/02/16) female.  Chief Complaint:  Chief Complaint  Patient presents with  . Follow-up  . Post-Traumatic Stress Disorder    HPI [name] presents to the office today for follow-up of anxiety and depression.  visit August 20, 2018 & 02/2019.  No meds were changed.  29-Aug-2020 appt noted:  Mother died 2019/08/13 from massive stroke.  Hemorrhagic. Can't cry very easily.  Wonders if sertraline interferes.  Wonders if it interferes with grief and she'll be having more problems later.  Doesn't feel like she's holding emotions back.   Has not felt flat before and has felt good emotionally.  Anxiety managed.   Good job supportive. Son to college in fall. Sisters and mother also on sertraline and Wellbutrin.  Patient reports stable mood and denies depressed or  irritable moods.  Patient denies any recent difficulty with anxiety.  Patient denies difficulty with sleep initiation or maintenance. Denies appetite disturbance.  Patient reports that energy and motivation have been good.  Patient denies any difficulty with concentration.  Patient denies any suicidal ideation.  Past Psychiatric Medication Trials: Sertraline 200, Wellbutrin XL 300, buspirone 30 twice daily, clonazepam, lorazepam, fluoxetine no response Stimulants anxiety  Review of Systems:  Review of Systems  Respiratory: Negative for shortness of breath.   Cardiovascular: Negative for palpitations.  Neurological: Negative for tremors and weakness.  Psychiatric/Behavioral: Negative for agitation, behavioral problems, confusion, decreased concentration, dysphoric mood, hallucinations, self-injury, sleep disturbance and suicidal ideas. The patient is not nervous/anxious and is not hyperactive.     Medications: I have reviewed the patient's current medications.  Current Outpatient Medications  Medication Sig Dispense Refill  . montelukast (SINGULAIR) 10 MG tablet TK 1 T PO QHS    . buPROPion (WELLBUTRIN XL) 300 MG 24 hr tablet Take 1 tablet (300 mg total) by mouth daily. 90 tablet 1  . busPIRone (BUSPAR) 30 MG tablet Take 1 tablet (30 mg total) by mouth at bedtime. 90 tablet 1  . sertraline (ZOLOFT) 100 MG tablet Take 2 tablets (200 mg total) by mouth daily. 180 tablet 1   No current facility-administered medications for this visit.    Medication Side Effects: None  Allergies: No Known Allergies  Past Medical History:  Diagnosis Date  . Abnormal Pap smear of cervix   . Allergy   . Anemia   . Anxiety   .  Asthma    allergic to dust - only uses inhaler prn - rarely  . Depression   . Headache    otc med prn - rare  . HPV in female   . Panic attacks   . PONV (postoperative nausea and vomiting)   . Sleep apnea    Using  CPAP  . SVD (spontaneous vaginal delivery)    x 3     Family History  Problem Relation Age of Onset  . Heart disease Mother   . Depression Mother   . Hypertension Mother   . Thyroid disease Mother   . Heart attack Father 79  . Heart failure Maternal Grandmother   . Diabetes Maternal Grandfather   . Heart attack Maternal Grandfather   . Psoriasis Sister     Social History   Socioeconomic History  . Marital status: Divorced    Spouse name: Not on file  . Number of children: Not on file  . Years of education: Not on file  . Highest education level: Not on file  Occupational History  . Not on file  Tobacco Use  . Smoking status: Never Smoker  . Smokeless tobacco: Never Used  Vaping Use  . Vaping Use: Never used  Substance and Sexual Activity  . Alcohol use: Yes    Comment: wine at night 2-3 days per week.   . Drug use: No  . Sexual activity: Not Currently    Partners: Male    Birth control/protection: None    Comment: divorced   Other Topics Concern  . Not on file  Social History Narrative  . Not on file   Social Determinants of Health   Financial Resource Strain: Not on file  Food Insecurity: Not on file  Transportation Needs: Not on file  Physical Activity: Not on file  Stress: Not on file  Social Connections: Not on file  Intimate Partner Violence: Not on file    Past Medical History, Surgical history, Social history, and Family history were reviewed and updated as appropriate.   Please see review of systems for further details on the patient's review from today.   Objective:   Physical Exam:  There were no vitals taken for this visit.  Physical Exam Neurological:     Mental Status: She is alert and oriented to person, place, and time.     Cranial Nerves: No dysarthria.  Psychiatric:        Attention and Perception: Attention normal.        Mood and Affect: Mood normal. Mood is not anxious or depressed.        Speech: Speech normal.        Behavior: Behavior is cooperative.        Thought  Content: Thought content normal. Thought content is not paranoid or delusional. Thought content does not include homicidal or suicidal ideation. Thought content does not include homicidal or suicidal plan.        Cognition and Memory: Cognition and memory normal.        Judgment: Judgment normal.     Comments: Grief over mother's death less intense than expected    Lab Review:     Component Value Date/Time   NA 136 03/14/2017 1042   K 4.4 03/14/2017 1042   CL 104 03/14/2017 1042   CO2 25 03/14/2017 1042   GLUCOSE 94 03/14/2017 1042   BUN 18 03/14/2017 1042   CREATININE 0.83 03/14/2017 1042   CALCIUM 9.4 03/14/2017 1042   PROT  6.9 03/14/2017 1042   AST 21 03/14/2017 1042   ALT 28 03/14/2017 1042   BILITOT 0.3 03/14/2017 1042       Component Value Date/Time   WBC 7.2 03/14/2017 1042   RBC 5.18 (H) 03/14/2017 1042   HGB 13.1 03/14/2017 1042   HCT 40.9 03/14/2017 1042   PLT 280 03/14/2017 1042   MCV 79.0 (L) 03/14/2017 1042   MCH 25.3 (L) 03/14/2017 1042   MCHC 32.0 03/14/2017 1042   RDW 13.3 03/14/2017 1042   LYMPHSABS 1,865 03/14/2017 1042   EOSABS 367 03/14/2017 1042   BASOSABS 86 03/14/2017 1042    No results found for: POCLITH, LITHIUM   No results found for: PHENYTOIN, PHENOBARB, VALPROATE, CBMZ   .res Asse2ssment: Plan:    PTSD (post-traumatic stress disorder) - Plan: busPIRone (BUSPAR) 30 MG tablet, sertraline (ZOLOFT) 100 MG tablet  Panic disorder with agoraphobia - Plan: sertraline (ZOLOFT) 100 MG tablet  Recurrent major depression in full remission ([place]) - Plan: buPROPion (WELLBUTRIN XL) 300 MG 24 hr tablet  History of ADD  Good response to meds. She recently lost her mother.  She is wondering if the meds may be interfering with normal tearfulness or could possibly complicate her grief reaction.  She is open to trying a lower dose of sertraline.  We discussed at length how sertraline can sometimes have somewhat of a numbing effect.  However she has not  typically experience that effect.  Discussed that this is dose related.  She reports each she has been stable on a lower dose of sertraline 150 mg in the past.  Has not been having recent panic or anxiety or depressive symptoms.  Discussed the risk of relapse with reduction in sertraline.  Especially in the context of recent grief.  If she has any complications or problems from the reduction she will let us know  Supportive therapy dealing with sisters severe migraine headache problems and kidney problems along with her mental health issues including anxiety.  Offered problem solving around achieving new physicians because she is moved to this area from another state.  Discussed stress management techniques such as relaxation exercises as well as physical exercise helping with her own stress and worry over her sister and her sister's health.  Continue sertraline for PTSD and an major depression and panic at 200 mg daily OK trial reduction in sertraline to 150 mg to see if grief tears are easier to come and history of stability in past on 150 mg daily. Continue buspirone 30 mg twice daily for PTSD and augmentation for depression Continue Wellbutrin XL 300 mg a.m. for depression and a history of ADD symptoms  We are not using stimulants at this time for ADD because of their tendency to make her anxious.  Follow-up 6 months  Lynder Parents, MD, DFAPA  Please see After Visit Summary for patient specific instructions.  No future appointments.  No orders of the defined types were placed in this encounter.     -------------------------------

## 2020-08-26 ENCOUNTER — Other Ambulatory Visit: Payer: Self-pay

## 2020-08-26 ENCOUNTER — Ambulatory Visit (INDEPENDENT_AMBULATORY_CARE_PROVIDER_SITE_OTHER): Payer: 59 | Admitting: Psychiatry

## 2020-08-26 ENCOUNTER — Encounter: Payer: Self-pay | Admitting: Psychiatry

## 2020-08-26 DIAGNOSIS — F4321 Adjustment disorder with depressed mood: Secondary | ICD-10-CM

## 2020-08-26 NOTE — Progress Notes (Signed)
      [facility] Counselor/Therapist Progress Note  Patient ID: Christy Adams, MRN: [id number],    Date: 08/26/2020  Time Spent: 50 minutes   Treatment Type: Individual Therapy  Reported Symptoms: grief sadness, irritation  Mental Status Exam:  Appearance:   Well Groomed     Behavior:  Appropriate  Motor:  Normal  Speech/Language:   Clear and Coherent  Affect:  Appropriate  Mood:  irritable and sad  Thought process:  normal  Thought content:    WNL  Sensory/Perceptual disturbances:    WNL  Orientation:  oriented to person, place, time/date and situation  Attention:  Good  Concentration:  Good  Memory:  WNL  Fund of knowledge:   Good  Insight:    Good  Judgment:   Good  Impulse Control:  Good   Risk Assessment: Danger to Self:  No Self-injurious Behavior: No Danger to Others: No Duty to Warn:no Physical Aggression / Violence:No  Access to Firearms a concern: No  Gang Involvement:No   Subjective: The client states that her mom died unexpectedly 4 weeks ago. "I can't seem to be sad. I have only cried 3 times in the last month." This has caused the client quite a bit of distress. She feels that she is being disloyal to her mom if she is not weeping all the time. Her sisters emote regularly and the client wonders what is wrong with her? 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 Feeling isn't always there strong suit. I also stated that she and her mother had a very good relationship. There was no unfinished business between them. She also is handling her mother's estate. She did her mother's eulogy and organized the funeral. I pointed out that she has done a number of things which normally helps people process their emotions. I suggested that she spend some time journaling about her mom and going through pictures or putting together a photo album. I also encouraged the client to develop some radical acceptance that she grieves differently  than others. During the course of the session as she used the bilateral stimulation hand paddles the client did become very sad and tearful. I pointed this out to the client that she did cry it just isn't as intense as others. The client agrees to try to work on these things. Her subjective units of distress went from a 5 to a 3.  Interventions: Motivational Interviewing, Solution-Oriented/Positive Psychology, [employer] Desensitization and Reprocessing (EMDR) and Insight-Oriented  Diagnosis:   ICD-10-CM   1. Adjustment disorder with depressed mood  F43.21     Plan: Radical acceptance, journaling, put together a photo book, pay attention to her grief process, mindful prayer.  Osmin Welz, [facility]

## 2020-10-20 ENCOUNTER — Other Ambulatory Visit: Payer: Self-pay

## 2020-10-20 ENCOUNTER — Encounter: Payer: Self-pay | Admitting: Psychiatry

## 2020-10-20 ENCOUNTER — Ambulatory Visit (INDEPENDENT_AMBULATORY_CARE_PROVIDER_SITE_OTHER): Payer: 59 | Admitting: Psychiatry

## 2020-10-20 ENCOUNTER — Telehealth: Payer: Self-pay | Admitting: Psychiatry

## 2020-10-20 DIAGNOSIS — F320 Major depressive disorder, single episode, mild: Secondary | ICD-10-CM

## 2020-10-20 NOTE — Telephone Encounter (Signed)
I found the note. At the last visit we decreased the sertraline to 150 mg from 200 mg daily.  Looks like that was not the right thing to do.  Increase sertraline back to 200 mg daily until her appt with me.  Should see a change in 2-4 weeks.

## 2020-10-20 NOTE — Telephone Encounter (Signed)
Patient was in the office today seeing Josph Macho. She is requesting a call from Dr Clovis Pu to discuss some medication changes. Patient has scheduled an appt for the first available 6/22. Please direct the call to # [phone].

## 2020-10-20 NOTE — Progress Notes (Signed)
      [facility] Counselor/Therapist Progress Note  Patient ID: GENESEE NASE, MRN: [id number],    Date: 10/20/2020  Time Spent: 50 minutes   Treatment Type: Individual Therapy  Reported Symptoms: anxious, irritable, sad, fragmented sleep, lethargy, lack of motivation, thoughts of death (no SI)  Mental Status Exam:  Appearance:   Well Groomed     Behavior:  Appropriate  Motor:  Normal  Speech/Language:   Clear and Coherent  Affect:  Depressed  Mood:  anxious, depressed, irritable and sad  Thought process:  normal  Thought content:    WNL  Sensory/Perceptual disturbances:    WNL  Orientation:  oriented to person, place, time/date and situation  Attention:  Good  Concentration:  Good  Memory:  WNL  Fund of knowledge:   Good  Insight:    Good  Judgment:   Good  Impulse Control:  Good   Risk Assessment: Danger to Self:  No Self-injurious Behavior: No Danger to Others: No Duty to Warn:no Physical Aggression / Violence:No  Access to Firearms a concern: No  Gang Involvement:No   Subjective: The client states that over the last month she finds that when she goes to bed she has thoughts of death.  "I find them annoying."  She feels heavy and sad along with being grumpy, impatient and irritable.  The client is still grieving the unexpected loss of her mom.  Her boyfriend of a year and a half broke up with her right after her mom's funeral.  She knows that he does not want kids and that they were not a good match.  She feels rejected though and her deepest desire is to have a life partner.  "He picked the worst time to leave."  The client states that she knows that her life is blessed but describes herself as being so tired and lethargic with a lack of motivation. I used eye movement with the client focusing on her current mood.  As the client tracked my fingers she became very tearful.  She does admit to a certain amount of hopelessness but no suicidal ideation.  I suggested to the  client that she might have shifted into a major depressive episode.  The client agrees.  I told the client that I would send a note to Dr. Clovis Pu alerting him to the changes.  The client feels that she is in a different place concerning her faith.  She still believes in God but finds herself less grounded and irritated by the saccharine responses she gets from her [place] [employer] friends.  I encouraged the client to increase her exercise.  I also asked her to follow up earlier with Dr. Clovis Pu.  As we finished the session the client's subjective units of distress went from a 8+ to less than 5. I also explained to the client that I would be retiring at the end of the week.  I gave the client a handout of therapists in this office and in the community.  She will consider Lina Sayre, [facility] in this office or [place] Dom, LCSW.  Interventions: Motivational Interviewing, Solution-Oriented/Positive Psychology, [facility] Desensitization and Reprocessing (EMDR) and Insight-Oriented  Diagnosis:   ICD-10-CM   1. Major depressive disorder, single episode, mild (HCC)  F32.0     Plan: Exercise, mood independent behavior, positive self talk, self-care, mindful prayer, sun exposure, engaged activities, social network.  Donice Alperin, [facility]

## 2020-10-20 NOTE — Telephone Encounter (Signed)
Pt stated she left a note from fred to pass along to you,she did not go into any details other than in the note.

## 2020-10-20 NOTE — Telephone Encounter (Signed)
Please get the note to my box at the door

## 2020-10-21 NOTE — Telephone Encounter (Signed)
Pt informed

## 2020-12-15 ENCOUNTER — Ambulatory Visit: Payer: 59 | Admitting: Psychiatry

## 2021-01-13 ENCOUNTER — Other Ambulatory Visit: Payer: Self-pay

## 2021-01-13 ENCOUNTER — Telehealth: Payer: Self-pay | Admitting: Psychiatry

## 2021-01-13 DIAGNOSIS — F4310 Post-traumatic stress disorder, unspecified: Secondary | ICD-10-CM

## 2021-01-13 DIAGNOSIS — F3342 Major depressive disorder, recurrent, in full remission: Secondary | ICD-10-CM

## 2021-01-13 DIAGNOSIS — F4001 Agoraphobia with panic disorder: Secondary | ICD-10-CM

## 2021-01-13 MED ORDER — BUPROPION HCL ER (XL) 300 MG PO TB24: 300.0000 mg | ORAL_TABLET | Freq: Every day | ORAL | 0 refills | Status: DC

## 2021-01-13 MED ORDER — BUSPIRONE HCL 30 MG PO TABS: 30.0000 mg | ORAL_TABLET | Freq: Every day | ORAL | 0 refills | Status: DC

## 2021-01-13 NOTE — Telephone Encounter (Signed)
Please send in this RX with pharmacy note it is ok to fill early

## 2021-01-13 NOTE — Telephone Encounter (Signed)
Christy Adams called to request vacation refill of about #15 of her Wellbutrin and Buspar.  She has refills coming from her mail order but they will not get here before she leaves on vacation.  She is asking for a short supply to get her through vacation.  Appt 9/27.  Send to [facility] on [address].

## 2021-01-13 NOTE — Telephone Encounter (Signed)
Please review

## 2021-01-13 NOTE — Telephone Encounter (Signed)
Both Rxs sent

## 2021-01-24 ENCOUNTER — Other Ambulatory Visit (HOSPITAL_COMMUNITY): Payer: Self-pay | Admitting: Orthopedic Surgery

## 2021-01-24 ENCOUNTER — Encounter (HOSPITAL_BASED_OUTPATIENT_CLINIC_OR_DEPARTMENT_OTHER): Payer: Self-pay | Admitting: Orthopedic Surgery

## 2021-01-24 ENCOUNTER — Other Ambulatory Visit: Payer: Self-pay

## 2021-01-25 ENCOUNTER — Encounter (HOSPITAL_BASED_OUTPATIENT_CLINIC_OR_DEPARTMENT_OTHER)
Admission: RE | Admit: 2021-01-25 | Discharge: 2021-01-25 | Disposition: A | Discharge status: Home / Self Care | Payer: 59 | Source: Ambulatory Visit | Attending: Orthopedic Surgery | Admitting: Orthopedic Surgery

## 2021-01-25 DIAGNOSIS — W228XXA Striking against or struck by other objects, initial encounter: Secondary | ICD-10-CM | POA: Diagnosis not present

## 2021-01-25 DIAGNOSIS — Z79899 Other long term (current) drug therapy: Secondary | ICD-10-CM | POA: Diagnosis not present

## 2021-01-25 DIAGNOSIS — Z20822 Contact with and (suspected) exposure to covid-19: Secondary | ICD-10-CM | POA: Diagnosis not present

## 2021-01-25 DIAGNOSIS — S92412A Displaced fracture of proximal phalanx of left great toe, initial encounter for closed fracture: Secondary | ICD-10-CM | POA: Diagnosis not present

## 2021-01-25 LAB — SARS CORONAVIRUS 2 (TAT 6-24 HRS): SARS Coronavirus 2: NEGATIVE

## 2021-01-25 NOTE — Progress Notes (Signed)

## 2021-01-27 ENCOUNTER — Encounter (HOSPITAL_BASED_OUTPATIENT_CLINIC_OR_DEPARTMENT_OTHER)
Admission: RE | Disposition: A | Discharge status: Home / Self Care | Payer: Self-pay | Source: Home / Self Care | Attending: Orthopedic Surgery

## 2021-01-27 ENCOUNTER — Ambulatory Visit (HOSPITAL_BASED_OUTPATIENT_CLINIC_OR_DEPARTMENT_OTHER): Payer: 59 | Admitting: Anesthesiology

## 2021-01-27 ENCOUNTER — Other Ambulatory Visit: Payer: Self-pay

## 2021-01-27 ENCOUNTER — Encounter (HOSPITAL_BASED_OUTPATIENT_CLINIC_OR_DEPARTMENT_OTHER): Payer: Self-pay | Admitting: Orthopedic Surgery

## 2021-01-27 ENCOUNTER — Ambulatory Visit (HOSPITAL_BASED_OUTPATIENT_CLINIC_OR_DEPARTMENT_OTHER)
Admission: RE | Admit: 2021-01-27 | Discharge: 2021-01-27 | Disposition: A | Discharge status: Home / Self Care | Payer: 59 | Attending: Orthopedic Surgery | Admitting: Orthopedic Surgery

## 2021-01-27 DIAGNOSIS — S92412D Displaced fracture of proximal phalanx of left great toe, subsequent encounter for fracture with routine healing: Secondary | ICD-10-CM

## 2021-01-27 DIAGNOSIS — Z79899 Other long term (current) drug therapy: Secondary | ICD-10-CM | POA: Insufficient documentation

## 2021-01-27 DIAGNOSIS — S92412A Displaced fracture of proximal phalanx of left great toe, initial encounter for closed fracture: Secondary | ICD-10-CM | POA: Diagnosis not present

## 2021-01-27 DIAGNOSIS — W228XXA Striking against or struck by other objects, initial encounter: Secondary | ICD-10-CM | POA: Insufficient documentation

## 2021-01-27 DIAGNOSIS — Z20822 Contact with and (suspected) exposure to covid-19: Secondary | ICD-10-CM | POA: Insufficient documentation

## 2021-01-27 HISTORY — PX: PERCUTANEOUS PINNING: SHX2209

## 2021-01-27 SURGERY — PINNING, EXTREMITY, PERCUTANEOUS
Anesthesia: General | Site: Foot | Laterality: Left

## 2021-01-27 MED ORDER — FENTANYL CITRATE (PF) 100 MCG/2ML IJ SOLN
INTRAMUSCULAR | Status: AC
  Filled 2021-01-27: qty 2

## 2021-01-27 MED ORDER — KETOROLAC TROMETHAMINE 30 MG/ML IJ SOLN
INTRAMUSCULAR | Status: AC
  Filled 2021-01-27: qty 1

## 2021-01-27 MED ORDER — MIDAZOLAM HCL 2 MG/2ML IJ SOLN
INTRAMUSCULAR | Status: AC
  Filled 2021-01-27: qty 2

## 2021-01-27 MED ORDER — SCOPOLAMINE 1 MG/3DAYS TD PT72
MEDICATED_PATCH | TRANSDERMAL | Status: AC
  Filled 2021-01-27: qty 1

## 2021-01-27 MED ORDER — CEFAZOLIN SODIUM-DEXTROSE 2-4 GM/100ML-% IV SOLN
INTRAVENOUS | Status: AC
  Filled 2021-01-27: qty 100

## 2021-01-27 MED ORDER — OXYCODONE HCL 5 MG/5ML PO SOLN: 5.0000 mg | Freq: Once | ORAL | Status: DC | PRN

## 2021-01-27 MED ORDER — SCOPOLAMINE 1 MG/3DAYS TD PT72
1.0000 | MEDICATED_PATCH | Freq: Once | TRANSDERMAL | Status: DC
  Administered 2021-01-27: 1.5 mg via TRANSDERMAL

## 2021-01-27 MED ORDER — MIDAZOLAM HCL 5 MG/5ML IJ SOLN
INTRAMUSCULAR | Status: DC | PRN
  Administered 2021-01-27: 2 mg via INTRAVENOUS

## 2021-01-27 MED ORDER — PROMETHAZINE HCL 25 MG/ML IJ SOLN
6.2500 mg | INTRAMUSCULAR | Status: DC | PRN
Start: 2021-01-27 — End: 2021-01-27

## 2021-01-27 MED ORDER — PROPOFOL 10 MG/ML IV BOLUS
INTRAVENOUS | Status: DC | PRN
  Administered 2021-01-27: 200 mg via INTRAVENOUS

## 2021-01-27 MED ORDER — BUPIVACAINE-EPINEPHRINE (PF) 0.5% -1:200000 IJ SOLN
INTRAMUSCULAR | Status: AC
  Filled 2021-01-27: qty 30

## 2021-01-27 MED ORDER — DEXAMETHASONE SODIUM PHOSPHATE 10 MG/ML IJ SOLN
INTRAMUSCULAR | Status: DC | PRN
  Administered 2021-01-27: 5 mg via INTRAVENOUS

## 2021-01-27 MED ORDER — FENTANYL CITRATE (PF) 100 MCG/2ML IJ SOLN
INTRAMUSCULAR | Status: DC | PRN
  Administered 2021-01-27 (×2): 50 ug via INTRAVENOUS

## 2021-01-27 MED ORDER — HYDROCODONE-ACETAMINOPHEN 5-325 MG PO TABS: 1.0000 | ORAL_TABLET | Freq: Four times a day (QID) | ORAL | 0 refills | Status: AC | PRN

## 2021-01-27 MED ORDER — ONDANSETRON HCL 4 MG/2ML IJ SOLN
INTRAMUSCULAR | Status: DC | PRN
  Administered 2021-01-27: 4 mg via INTRAVENOUS

## 2021-01-27 MED ORDER — ACETAMINOPHEN 500 MG PO TABS
ORAL_TABLET | ORAL | Status: AC
  Filled 2021-01-27: qty 2

## 2021-01-27 MED ORDER — VANCOMYCIN HCL 500 MG IV SOLR
INTRAVENOUS | Status: AC
  Filled 2021-01-27: qty 500

## 2021-01-27 MED ORDER — LACTATED RINGERS IV SOLN: INTRAVENOUS | Status: DC

## 2021-01-27 MED ORDER — ACETAMINOPHEN 500 MG PO TABS
1000.0000 mg | ORAL_TABLET | Freq: Once | ORAL | Status: AC
  Administered 2021-01-27: 1000 mg via ORAL

## 2021-01-27 MED ORDER — BUPIVACAINE-EPINEPHRINE 0.5% -1:200000 IJ SOLN
INTRAMUSCULAR | Status: DC | PRN
  Administered 2021-01-27: 10 mL

## 2021-01-27 MED ORDER — FENTANYL CITRATE (PF) 100 MCG/2ML IJ SOLN: 25.0000 ug | INTRAMUSCULAR | Status: DC | PRN

## 2021-01-27 MED ORDER — LIDOCAINE 2% (20 MG/ML) 5 ML SYRINGE
INTRAMUSCULAR | Status: DC | PRN
  Administered 2021-01-27: 100 mg via INTRAVENOUS

## 2021-01-27 MED ORDER — CEFAZOLIN SODIUM-DEXTROSE 2-4 GM/100ML-% IV SOLN
2.0000 g | INTRAVENOUS | Status: AC
  Administered 2021-01-27: 2 g via INTRAVENOUS

## 2021-01-27 MED ORDER — KETOROLAC TROMETHAMINE 30 MG/ML IJ SOLN
30.0000 mg | Freq: Once | INTRAMUSCULAR | Status: AC
  Administered 2021-01-27: 30 mg via INTRAVENOUS

## 2021-01-27 MED ORDER — OXYCODONE HCL 5 MG PO TABS
5.0000 mg | ORAL_TABLET | Freq: Once | ORAL | Status: DC | PRN
Start: 2021-01-27 — End: 2021-01-27

## 2021-01-27 SURGICAL SUPPLY — 72 items
APL PRP STRL LF DISP 70% ISPRP (MISCELLANEOUS) ×2
BANDAGE ESMARK 6X9 LF (GAUZE/BANDAGES/DRESSINGS) IMPLANT
BLADE SURG 15 STRL LF DISP TIS (BLADE) ×4 IMPLANT
BLADE SURG 15 STRL SS (BLADE) ×6
BNDG CMPR 9X4 STRL LF SNTH (GAUZE/BANDAGES/DRESSINGS)
BNDG CMPR 9X6 STRL LF SNTH (GAUZE/BANDAGES/DRESSINGS)
BNDG COHESIVE 4X5 TAN ST LF (GAUZE/BANDAGES/DRESSINGS) ×3 IMPLANT
BNDG COHESIVE 6X5 TAN ST LF (GAUZE/BANDAGES/DRESSINGS) IMPLANT
BNDG CONFORM 2 STRL LF (GAUZE/BANDAGES/DRESSINGS) ×3 IMPLANT
BNDG ELASTIC 4X5.8 VLCR STR LF (GAUZE/BANDAGES/DRESSINGS) IMPLANT
BNDG ESMARK 4X9 LF (GAUZE/BANDAGES/DRESSINGS) IMPLANT
BNDG ESMARK 6X9 LF (GAUZE/BANDAGES/DRESSINGS)
BOOT STEPPER DURA LG (SOFTGOODS) IMPLANT
BOOT STEPPER DURA MED (SOFTGOODS) IMPLANT
CANISTER SUCT 1200ML W/VALVE (MISCELLANEOUS) ×3 IMPLANT
CAP PIN PROTECTOR ORTHO WHT (CAP) ×4 IMPLANT
CHLORAPREP W/TINT 26 (MISCELLANEOUS) ×3 IMPLANT
COVER BACK TABLE 60X90IN (DRAPES) ×3 IMPLANT
COVER MAYO STAND STRL (DRAPES) ×2 IMPLANT
CUFF TOURN SGL QUICK 24 (TOURNIQUET CUFF) ×3
CUFF TOURN SGL QUICK 34 (TOURNIQUET CUFF)
CUFF TRNQT CYL 24X4X16.5-23 (TOURNIQUET CUFF) ×1 IMPLANT
CUFF TRNQT CYL 34X4.125X (TOURNIQUET CUFF) IMPLANT
DECANTER SPIKE VIAL GLASS SM (MISCELLANEOUS) IMPLANT
DRAPE EXTREMITY T 121X128X90 (DISPOSABLE) ×3 IMPLANT
DRAPE OEC MINIVIEW 54X84 (DRAPES) ×3 IMPLANT
DRAPE U-SHAPE 47X51 STRL (DRAPES) ×3 IMPLANT
DRSG MEPITEL 4X7.2 (GAUZE/BANDAGES/DRESSINGS) ×3 IMPLANT
DRSG PAD ABDOMINAL 8X10 ST (GAUZE/BANDAGES/DRESSINGS) ×4 IMPLANT
ELECT REM PT RETURN 9FT ADLT (ELECTROSURGICAL) ×3
ELECTRODE REM PT RTRN 9FT ADLT (ELECTROSURGICAL) ×2 IMPLANT
GAUZE SPONGE 4X4 12PLY STRL (GAUZE/BANDAGES/DRESSINGS) ×3 IMPLANT
GLOVE SRG 8 PF TXTR STRL LF DI (GLOVE) ×3 IMPLANT
GLOVE SURG ENC MOIS LTX SZ8 (GLOVE) ×3 IMPLANT
GLOVE SURG LTX SZ8 (GLOVE) ×1 IMPLANT
GLOVE SURG POLYISO LF SZ7 (GLOVE) ×2 IMPLANT
GLOVE SURG UNDER POLY LF SZ7 (GLOVE) ×4 IMPLANT
GLOVE SURG UNDER POLY LF SZ8 (GLOVE) ×3
GOWN STRL REUS W/ TWL LRG LVL3 (GOWN DISPOSABLE) ×2 IMPLANT
GOWN STRL REUS W/ TWL XL LVL3 (GOWN DISPOSABLE) ×3 IMPLANT
GOWN STRL REUS W/TWL LRG LVL3 (GOWN DISPOSABLE) ×3
GOWN STRL REUS W/TWL XL LVL3 (GOWN DISPOSABLE) ×3
K-WIRE DBL END .054 LG (WIRE) ×6 IMPLANT
NEEDLE HYPO 22GX1.5 SAFETY (NEEDLE) IMPLANT
NS IRRIG 1000ML POUR BTL (IV SOLUTION) ×3 IMPLANT
PACK BASIN DAY SURGERY FS (CUSTOM PROCEDURE TRAY) ×3 IMPLANT
PAD CAST 4YDX4 CTTN HI CHSV (CAST SUPPLIES) ×3 IMPLANT
PADDING CAST ABS 4INX4YD NS (CAST SUPPLIES)
PADDING CAST ABS COTTON 4X4 ST (CAST SUPPLIES) IMPLANT
PADDING CAST COTTON 4X4 STRL (CAST SUPPLIES) ×6
PADDING CAST COTTON 6X4 STRL (CAST SUPPLIES) IMPLANT
PENCIL SMOKE EVACUATOR (MISCELLANEOUS) ×1 IMPLANT
SANITIZER HAND PURELL 535ML FO (MISCELLANEOUS) ×3 IMPLANT
SHEET MEDIUM DRAPE 40X70 STRL (DRAPES) ×5 IMPLANT
SLEEVE SCD COMPRESS KNEE MED (STOCKING) ×3 IMPLANT
SPONGE T-LAP 18X18 ~~LOC~~+RFID (SPONGE) ×1 IMPLANT
STOCKINETTE 6  STRL (DRAPES) ×3
STOCKINETTE 6 STRL (DRAPES) ×2 IMPLANT
SUCTION FRAZIER HANDLE 10FR (MISCELLANEOUS)
SUCTION TUBE FRAZIER 10FR DISP (MISCELLANEOUS) ×1 IMPLANT
SUT ETHILON 3 0 PS 1 (SUTURE) ×1 IMPLANT
SUT FIBERWIRE #2 38 T-5 BLUE (SUTURE)
SUT MNCRL AB 3-0 PS2 18 (SUTURE) IMPLANT
SUT VIC AB 0 SH 27 (SUTURE) IMPLANT
SUT VIC AB 2-0 SH 27 (SUTURE)
SUT VIC AB 2-0 SH 27XBRD (SUTURE) IMPLANT
SUTURE FIBERWR #2 38 T-5 BLUE (SUTURE) IMPLANT
SYR BULB EAR ULCER 3OZ GRN STR (SYRINGE) ×1 IMPLANT
SYR CONTROL 10ML LL (SYRINGE) ×2 IMPLANT
TOWEL GREEN STERILE FF (TOWEL DISPOSABLE) ×6 IMPLANT
TUBE CONNECTING 20X1/4 (TUBING) ×1 IMPLANT
UNDERPAD 30X36 HEAVY ABSORB (UNDERPADS AND DIAPERS) ×3 IMPLANT

## 2021-01-27 NOTE — H&P (Signed)
Christy Adams is an 50 y.o. female.   Chief Complaint: left foot pain HPI: The patient is a 50 year old woman without significant past medical history.  She injured her left hallux last week when she tripped over a child's toy and jammed the big toe against a step.  She was traveling out of town at the time.  She returned to [facility] and was seen in the office.  Radiographs revealed a displaced intra-articular fracture of the head of the proximal phalanx.  She presents today for surgical treatment of this displaced intra-articular fracture.  Past Medical History:  Diagnosis Date   Abnormal Pap smear of cervix    Allergy    Anemia    Anxiety    Asthma    allergic to dust - only uses inhaler prn - rarely   Depression    Headache    otc med prn - rare   HPV in female    Panic attacks    PONV (postoperative nausea and vomiting)    Sleep apnea    no CPAP   SVD (spontaneous vaginal delivery)    x 3    Past Surgical History:  Procedure Laterality Date   ABLATION  09/10/2009   COLPOSCOPY  05/21/2017   By [facility]    DILATION AND CURETTAGE OF UTERUS  09/10/2009   hysteroscopy with Novasure   excision of vaginal mole  01/10/2005   EYE SURGERY     strabismus, lens surgery   KNEE SURGERY     left   LAPAROSCOPIC TUBAL LIGATION Bilateral 09/14/2014   Procedure: LAPAROSCOPIC TUBAL LIGATION;  Surgeon: Paula Compton, MD;  Location: [facility];  Service: Gynecology;  Laterality: Bilateral;   TONSILLECTOMY     TUBAL LIGATION     wisedom teeth      Family History  Problem Relation Age of Onset   Heart disease Mother    Depression Mother    Hypertension Mother    Thyroid disease Mother    Heart attack Father 60   Heart failure Maternal Grandmother    Diabetes Maternal Grandfather    Heart attack Maternal Grandfather    Psoriasis Sister    Social History:  reports that she has never smoked. She has never used smokeless tobacco. She reports current alcohol use. She  reports that she does not use drugs.  Allergies: No Known Allergies  Medications Prior to Admission  Medication Sig Dispense Refill   amoxicillin (AMOXIL) 875 MG tablet Take 875 mg by mouth 2 (two) times daily.     buPROPion (WELLBUTRIN XL) 300 MG 24 hr tablet Take 1 tablet (300 mg total) by mouth daily. 15 tablet 0   busPIRone (BUSPAR) 30 MG tablet Take 1 tablet (30 mg total) by mouth at bedtime. 15 tablet 0   ergocalciferol (VITAMIN D2) 1.25 MG (50000 UT) capsule Take 50,000 Units by mouth once a week.     sertraline (ZOLOFT) 100 MG tablet Take 2 tablets (200 mg total) by mouth daily. 180 tablet 1   montelukast (SINGULAIR) 10 MG tablet TK 1 T PO QHS      No results found for this or any previous visit (from the past 48 hour(s)). No results found.  Review of Systems no recent fever, chills, nausea, vomiting or changes in her appetite  Blood pressure (!) 104/41, pulse 61, temperature 98.5 F (36.9 C), temperature source Oral, resp. rate 18, height '5\' 6"'$  (1.676 m), weight 96.4 kg, SpO2 96 %. Physical Exam  Well-nourished well-developed woman  in no apparent distress.  Alert and oriented x4.  Normal mood and affect.  Gait is flatfoot flatfoot on the left in a postop shoe.  The hallux is swollen with some ecchymosis.  Skin is otherwise healthy and intact.  There is valgus deviation of the toe at the level of the IP joint.  There is capillary refill at the toe.  Sensibility to light touch is intact throughout the foot.   Assessment/Plan Left hallux proximal phalanx fracture -to the operating room today for closed reduction and pinning versus open treatment with internal fixation.  The risks and benefits of the alternative treatment options have been discussed in detail.  The patient wishes to proceed with surgery and specifically understands risks of bleeding, infection, nerve damage, blood clots, need for additional surgery, amputation and death.   Wylene Simmer, MD February 15, 2021, 12:55 PM

## 2021-01-27 NOTE — Anesthesia Preprocedure Evaluation (Addendum)
Anesthesia Evaluation  Patient identified by MRN, date of birth, ID band Patient awake    Reviewed: Allergy & Precautions, NPO status , Patient's Chart, lab work & pertinent test results  History of Anesthesia Complications (+) PONV and history of anesthetic complications  Airway Mallampati: II  TM Distance: >3 FB Neck ROM: Full    Dental no notable dental hx.    Pulmonary asthma , sleep apnea ,    Pulmonary exam normal        Cardiovascular negative cardio ROS Normal cardiovascular exam     Neuro/Psych Anxiety Depression    GI/Hepatic negative GI ROS, Neg liver ROS,   Endo/Other  negative endocrine ROS  Renal/GU negative Renal ROS  negative genitourinary   Musculoskeletal displaced fracture of proximal phalanx left great toe   Abdominal   Peds  Hematology negative hematology ROS (+)   Anesthesia Other Findings Day of surgery medications reviewed with patient.  Reproductive/Obstetrics negative OB ROS                            Anesthesia Physical Anesthesia Plan  ASA: 2  Anesthesia Plan: General   Post-op Pain Management:    Induction: Intravenous  PONV Risk Score and Plan: 4 or greater and Treatment may vary due to age or medical condition, Ondansetron, Dexamethasone, Midazolam and Scopolamine patch - Pre-op  Airway Management Planned: LMA  Additional Equipment: None  Intra-op Plan:   Post-operative Plan: Extubation in OR  Informed Consent: I have reviewed the patients History and Physical, chart, labs and discussed the procedure including the risks, benefits and alternatives for the proposed anesthesia with the patient or authorized representative who has indicated his/her understanding and acceptance.     Dental advisory given  Plan Discussed with: CRNA  Anesthesia Plan Comments:        Anesthesia Quick Evaluation

## 2021-01-27 NOTE — Anesthesia Postprocedure Evaluation (Signed)
Anesthesia Post Note  Patient: Christy Adams  Procedure(s) Performed: Closed reduction and pinning of the left hallux proximal phalanx (Left: Foot)     Patient location during evaluation: PACU Anesthesia Type: General Level of consciousness: awake and alert and oriented Pain management: pain level controlled Vital Signs Assessment: post-procedure vital signs reviewed and stable Respiratory status: spontaneous breathing, nonlabored ventilation and respiratory function stable Cardiovascular status: blood pressure returned to baseline Postop Assessment: no apparent nausea or vomiting Anesthetic complications: no   No notable events documented.  Last Vitals:  Vitals:   01/27/21 1445 01/27/21 1500  BP: 115/74 (!) 111/56  Pulse: 62 (!) 58  Resp: 17 15  Temp:    SpO2: 97% 98%    Last Pain:  Vitals:   01/27/21 1500  TempSrc:   PainSc: 1     LLE Motor Response: Purposeful movement (01/27/21 1500) LLE Sensation: Numbness (01/27/21 1500)          [name]

## 2021-01-27 NOTE — Anesthesia Procedure Notes (Signed)
Procedure Name: LMA Insertion Date/Time: 01/27/2021 1:24 PM Performed by: Glory Buff, CRNA Pre-anesthesia Checklist: Patient identified, Emergency Drugs available, Suction available and Patient being monitored Patient Re-evaluated:Patient Re-evaluated prior to induction Oxygen Delivery Method: Circle system utilized Preoxygenation: Pre-oxygenation with 100% oxygen Induction Type: IV induction LMA: LMA inserted LMA Size: 4.0 Number of attempts: 1 Placement Confirmation: positive ETCO2 Tube secured with: Tape Dental Injury: Teeth and Oropharynx as per pre-operative assessment

## 2021-01-27 NOTE — Discharge Instructions (Addendum)
May take Tylenol after 5:30p.  Wylene Simmer, MD [facility]  Please read the following information regarding your care after surgery.  Medications  You only need a prescription for the narcotic pain medicine (ex. oxycodone, Percocet, Norco).  All of the other medicines listed below are available over the counter. X Aleve 2 pills twice a day for the first 3 days after surgery. X hydrocodone as prescribed for severe pain  Weight Bearing ? Bear weight when you are able on your operated leg or foot. X Bear weight only on your operated foot in the post-op shoe. ? Do not bear any weight on the operated leg or foot.  Cast / Splint / Dressing X Keep your splint, cast or dressing clean and dry.  Don't put anything (coat hanger, pencil, etc) down inside of it.  If it gets damp, use a hair dryer on the cool setting to dry it.  If it gets soaked, call the office to schedule an appointment for a cast change. ? Remove your dressing 3 days after surgery and cover the incisions with dry dressings.    After your dressing, cast or splint is removed; you may shower, but do not soak or scrub the wound.  Allow the water to run over it, and then gently pat it dry.  Swelling It is normal for you to have swelling where you had surgery.  To reduce swelling and pain, keep your toes above your nose for at least 3 days after surgery.  It may be necessary to keep your foot or leg elevated for several weeks.  If it hurts, it should be elevated.  Follow Up Call my office at [phone] when you are discharged from the hospital or surgery center to schedule an appointment to be seen two weeks after surgery.  Call my office at [phone] if you develop a fever >101.5 F, nausea, vomiting, bleeding from the surgical site or severe pain.   May take NSAIDS (Ibuprofen, Motrin) after 9 pm, if needed.   Post Anesthesia Home Care Instructions  Activity: Get plenty of rest for the remainder of the day. A responsible  individual must stay with you for 24 hours following the procedure.  For the next 24 hours, DO NOT: -Drive a car -Paediatric nurse -Drink alcoholic beverages -Take any medication unless instructed by your physician -Make any legal decisions or sign important papers.  Meals: Start with liquid foods such as gelatin or soup. Progress to regular foods as tolerated. Avoid greasy, spicy, heavy foods. If nausea and/or vomiting occur, drink only clear liquids until the nausea and/or vomiting subsides. Call your physician if vomiting continues.  Special Instructions/Symptoms: Your throat may feel dry or sore from the anesthesia or the breathing tube placed in your throat during surgery. If this causes discomfort, gargle with warm salt water. The discomfort should disappear within 24 hours.  If you had a scopolamine patch placed behind your ear for the management of post- operative nausea and/or vomiting:  1. The medication in the patch is effective for 72 hours, after which it should be removed.  Wrap patch in a tissue and discard in the trash. Wash hands thoroughly with soap and water. 2. You may remove the patch earlier than 72 hours if you experience unpleasant side effects which may include dry mouth, dizziness or visual disturbances. 3. Avoid touching the patch. Wash your hands with soap and water after contact with the patch.

## 2021-01-27 NOTE — Transfer of Care (Signed)
Immediate Anesthesia Transfer of Care Note  Patient: Christy Adams  Procedure(s) Performed: Closed reduction and pinning of the left hallux proximal phalanx (Left: Foot)  Patient Location: PACU  Anesthesia Type:General  Level of Consciousness: awake, alert  and oriented  Airway & Oxygen Therapy: Patient Spontanous Breathing and Patient connected to face mask oxygen  Post-op Assessment: Report given to RN and Post -op Vital signs reviewed and stable  Post vital signs: Reviewed and stable  Last Vitals:  Vitals Value Taken Time  BP 109/77 01/27/21 1422  Temp    Pulse 60 01/27/21 1425  Resp 14 01/27/21 1425  SpO2 100 % 01/27/21 1425  Vitals shown include unvalidated device data.  Last Pain:  Vitals:   01/27/21 1127  TempSrc: Oral  PainSc: 2          Complications: No notable events documented.

## 2021-01-27 NOTE — Op Note (Signed)
01/27/2021  2:23 PM  PATIENT:  Christy Adams  50 y.o. female  PRE-OPERATIVE DIAGNOSIS:  displaced fracture of proximal phalanx left great toe  POST-OPERATIVE DIAGNOSIS:  displaced fracture of proximal phalanx left great toe  Procedure(s): Closed reduction and pinning of the left hallux proximal phalanx  SURGEON:  Wylene Simmer, MD  ASSISTANT: none  ANESTHESIA:   General, local  EBL:  minimal   TOURNIQUET:  none  COMPLICATIONS:  None apparent  DISPOSITION:  Extubated, awake and stable to recovery.  INDICATION FOR PROCEDURE: The patient is a 50 year old female who injured her left hallux about a week ago.  She tripped over a child's toy and kicked a piece of furniture sustaining a displaced intra-articular fracture of the head of the proximal phalanx of her left hallux.  She presents now for operative treatment of this displaced intra-articular fracture.  The risks and benefits of the alternative treatment options have been discussed in detail.  The patient wishes to proceed with surgery and specifically understands risks of bleeding, infection, nerve damage, blood clots, need for additional surgery, amputation and death.   PROCEDURE IN DETAIL: After preoperative consent was obtained and the correct operative site was identified, the patient was brought the operating room and placed supine on the operating table.  General anesthesia was induced.  Preoperative antibiotics were administered.  Surgical timeout was taken.  The left lower extremity was prepped and draped in standard sterile fashion with a tourniquet around the calf.  Close reduction was performed of the left hallux.  Radiographs confirmed appropriate reduction of the lateral fragment.  A 0.054 K wire was inserted from the distal lateral corner of the bone across the fracture and engaging the medial cortex.  A second K wire was then placed in the medial articular fragment.  This wire was used as a joystick to reduce the fracture  fragment.  A second K wire was then advanced just plantar to the first.  It was placed across the fracture site engaging both of the articular fragments.  Both pins were then bent and trimmed.  A third wire was placed from the distal medial corner of the fracture and across the fracture site to engage the subchondral bone at the base of the proximal phalanx.  This pin was bent and trimmed.  Final K wire was inserted from the proximal medial corner of the bone and across the fracture line to engage the distal lateral fragment at the subchondral bone.  This pin was bent and trimmed.  Final AP, lateral and oblique radiographs showed appropriate position and length of all hardware and appropriate reduction of the comminuted intra-articular fracture.  All 4 pins were capped.  Sterile dressings were applied followed by a compression wrap.  The patient was then awakened from anesthesia and transported to the recovery room in stable condition.  A digital block with half percent Marcaine with epinephrine had been performed prior to reducing the fracture.   FOLLOW UP PLAN: Weightbearing as tolerated in a flat postop shoe.  Follow-up in the office in 2 weeks for a wound check.  Plan to remove the pins in 6 weeks.   RADIOGRAPHS: AP, lateral and oblique radiographs of the left foot are obtained intraoperatively.  These show interval reduction of the hallux fracture and appropriate position and length of all of the pins.  Hardware is appropriately positioned and of the appropriate lengths.  No other acute injuries are noted.

## 2021-01-28 ENCOUNTER — Encounter (HOSPITAL_BASED_OUTPATIENT_CLINIC_OR_DEPARTMENT_OTHER): Payer: Self-pay | Admitting: Orthopedic Surgery

## 2021-01-28 NOTE — Progress Notes (Signed)
Late entry: Versed '2mg'$  and Fentanyl 170mg pulled from pyxis and drawn up in syringes in anticipation of being administered prior to a peripheral nerve block. Anesthesiologist did not choose to perform the block, so neither med was given. Meds returned to pharmacy for appropriate dispensation.

## 2021-03-01 ENCOUNTER — Other Ambulatory Visit: Payer: Self-pay | Admitting: Psychiatry

## 2021-03-01 DIAGNOSIS — F3342 Major depressive disorder, recurrent, in full remission: Secondary | ICD-10-CM

## 2021-03-16 ENCOUNTER — Other Ambulatory Visit: Payer: Self-pay | Admitting: Psychiatry

## 2021-03-16 DIAGNOSIS — F4310 Post-traumatic stress disorder, unspecified: Secondary | ICD-10-CM

## 2021-03-22 ENCOUNTER — Ambulatory Visit: Payer: 59 | Admitting: Psychiatry

## 2021-07-11 ENCOUNTER — Other Ambulatory Visit: Payer: Self-pay | Admitting: Psychiatry

## 2021-07-11 DIAGNOSIS — F4001 Agoraphobia with panic disorder: Secondary | ICD-10-CM

## 2021-07-11 DIAGNOSIS — F4310 Post-traumatic stress disorder, unspecified: Secondary | ICD-10-CM

## 2021-07-11 NOTE — Telephone Encounter (Signed)
Call to RS

## 2021-07-12 ENCOUNTER — Other Ambulatory Visit: Payer: Self-pay

## 2021-07-12 ENCOUNTER — Ambulatory Visit (INDEPENDENT_AMBULATORY_CARE_PROVIDER_SITE_OTHER): Payer: 59 | Admitting: Psychiatry

## 2021-07-12 ENCOUNTER — Encounter: Payer: Self-pay | Admitting: Psychiatry

## 2021-07-12 DIAGNOSIS — F3342 Major depressive disorder, recurrent, in full remission: Secondary | ICD-10-CM | POA: Diagnosis not present

## 2021-07-12 DIAGNOSIS — F4310 Post-traumatic stress disorder, unspecified: Secondary | ICD-10-CM | POA: Diagnosis not present

## 2021-07-12 DIAGNOSIS — F4001 Agoraphobia with panic disorder: Secondary | ICD-10-CM | POA: Diagnosis not present

## 2021-07-12 MED ORDER — SERTRALINE HCL 100 MG PO TABS: ORAL_TABLET | ORAL | 0 refills | Status: DC

## 2021-07-12 MED ORDER — FLUOXETINE HCL 20 MG PO CAPS: 20.0000 mg | ORAL_CAPSULE | Freq: Every day | ORAL | 0 refills | Status: DC

## 2021-07-12 MED ORDER — BUSPIRONE HCL 30 MG PO TABS: 30.0000 mg | ORAL_TABLET | Freq: Every day | ORAL | 1 refills | Status: DC

## 2021-07-12 MED ORDER — BUPROPION HCL ER (XL) 300 MG PO TB24: 300.0000 mg | ORAL_TABLET | Freq: Every day | ORAL | 0 refills | Status: DC

## 2021-07-12 NOTE — Progress Notes (Signed)
Christy Adams [id number] April 14, 1971 51 y.o.    1Q Subjective:   Patient ID:  Christy Adams is a 51 y.o. (DOB [birth year]) female.  Chief Complaint:  Chief Complaint  Patient presents with   Follow-up   Post-Traumatic Stress Disorder   Depression   Anxiety   Medication Problem    HPI Christy Adams presents to the office today for follow-up of anxiety and depression.  visit August 20, 2018 & 02/2019.  No meds were changed.  12-Sep-2020 appt noted:  Mother died 08-27-2019 from massive stroke.  Hemorrhagic. Can't cry very easily.  Wonders if sertraline interferes.  Wonders if it interferes with grief and she'll be having more problems later.  Doesn't feel like she's holding emotions back.   Has not felt flat before and has felt good emotionally.  Anxiety managed.   Good job supportive. Son to college in fall. Sisters and mother also on sertraline and Wellbutrin. pLAN: OK trial reduction in sertraline to 150 mg to see if grief tears are easier to come and history of stability in past on 150 mg daily. Continue buspirone 30 mg twice daily for PTSD and augmentation for depression Continue Wellbutrin XL 300 mg a.m. for depression and a history of ADD symptoms  10/20/20 TC:  wanted med change.  Increased sertraline back to 200 mg daily.  07/12/2021 appt noted: Has been trying to stick with sertraline 150 mg daily.  Occ misses a day. Has been runnin gout of buspar and didn't feel good. On way hand function well.  Hard year bc losing mother.   Still don't cry easily on sertraline and doesn't like it. Restarting therapy. Overall feels ok with meds. No sig anxiety but a lot of emotional things and I feel detached. Not hlpeless.  Bluntness can be surprising. Sister health issues.  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  Patient reports stable mood and denies depressed or irritable moods.  Patient denies any recent difficulty with anxiety.  Patient denies difficulty with sleep initiation or  maintenance. Denies appetite disturbance.  Patient reports that energy and motivation have been good.  Patient denies any difficulty with concentration.  Patient denies any suicidal ideation.  Past Psychiatric Medication Trials: Sertraline 200 (on Zoloft 20 years)  Wellbutrin XL 300, buspirone 30 twice daily, fluoxetine no response in early 20's clonazepam, lorazepam,  Stimulants anxiety  Review of Systems:  Review of Systems  Respiratory:  Negative for shortness of breath.   Cardiovascular:  Negative for chest pain and palpitations.  Neurological:  Negative for tremors.  Psychiatric/Behavioral:  Negative for agitation, behavioral problems, confusion, decreased concentration, dysphoric mood, hallucinations, self-injury, sleep disturbance and suicidal ideas. The patient is not nervous/anxious and is not hyperactive.    Medications: I have reviewed the patient's current medications.  Current Outpatient Medications  Medication Sig Dispense Refill   montelukast (SINGULAIR) 10 MG tablet TK 1 T PO QHS     buPROPion (WELLBUTRIN XL) 300 MG 24 hr tablet Take 1 tablet (300 mg total) by mouth daily. 90 tablet 0   busPIRone (BUSPAR) 30 MG tablet Take 1 tablet (30 mg total) by mouth at bedtime. 180 tablet 1   ergocalciferol (VITAMIN D2) 1.25 MG (50000 UT) capsule Take 50,000 Units by mouth once a week. (Patient not taking: Reported on 07/12/2021)     FLUoxetine (PROZAC) 20 MG capsule Take 1 capsule (20 mg total) by mouth daily. 90 capsule 0   sertraline (ZOLOFT) 100 MG tablet 1 daily for 2 weeks then  1/2 daily for 2 weeks then stop it. 30 tablet 0   No current facility-administered medications for this visit.    Medication Side Effects: None  Allergies: No Known Allergies  Past Medical History:  Diagnosis Date   Abnormal Pap smear of cervix    Allergy    Anemia    Anxiety    Asthma    allergic to dust - only uses inhaler prn - rarely   Depression    Headache    otc med prn - rare   HPV  in female    Panic attacks    PONV (postoperative nausea and vomiting)    Sleep apnea    no CPAP   SVD (spontaneous vaginal delivery)    x 3    Family History  Problem Relation Age of Onset   Heart disease Mother    Depression Mother    Hypertension Mother    Thyroid disease Mother    Heart attack Father 67   Heart failure Maternal Grandmother    Diabetes Maternal Grandfather    Heart attack Maternal Grandfather    Psoriasis Sister     Social History   Socioeconomic History   Marital status: Divorced    Spouse name: Not on file   Number of children: Not on file   Years of education: Not on file   Highest education level: Not on file  Occupational History   Not on file  Tobacco Use   Smoking status: Never   Smokeless tobacco: Never  Vaping Use   Vaping Use: Never used  Substance and Sexual Activity   Alcohol use: Yes    Comment: wine at night 2-3 days per week.    Drug use: No   Sexual activity: Not Currently    Partners: Male    Comment: ablation and BTL  Other Topics Concern   Not on file  Social History Narrative   Not on file   Social Determinants of Health   Financial Resource Strain: Not on file  Food Insecurity: Not on file  Transportation Needs: Not on file  Physical Activity: Not on file  Stress: Not on file  Social Connections: Not on file  Intimate Partner Violence: Not on file    Past Medical History, Surgical history, Social history, and Family history were reviewed and updated as appropriate.   Please see review of systems for further details on the patient's review from today.   Objective:   Physical Exam:  There were no vitals taken for this visit.  Physical Exam Constitutional:      General: She is not in acute distress. Musculoskeletal:        General: No deformity.  Neurological:     Mental Status: She is alert and oriented to person, place, and time.     Cranial Nerves: No dysarthria.     Coordination: Coordination  normal.  Psychiatric:        Attention and Perception: Attention and perception normal. She does not perceive auditory or visual hallucinations.        Mood and Affect: Mood normal. Mood is not anxious or depressed. Affect is not labile, blunt, angry or inappropriate.        Speech: Speech normal.        Behavior: Behavior normal. Behavior is cooperative.        Thought Content: Thought content normal. Thought content is not delusional. Thought content does not include homicidal or suicidal ideation. Thought content does not include suicidal  plan.        Cognition and Memory: Cognition and memory normal.        Judgment: Judgment normal.     Comments: Grief over mother's death less intense than expected   Lab Review:     Component Value Date/Time   NA 136 03/14/2017 1042   K 4.4 03/14/2017 1042   CL 104 03/14/2017 1042   CO2 25 03/14/2017 1042   GLUCOSE 94 03/14/2017 1042   BUN 18 03/14/2017 1042   CREATININE 0.83 03/14/2017 1042   CALCIUM 9.4 03/14/2017 1042   PROT 6.9 03/14/2017 1042   AST 21 03/14/2017 1042   ALT 28 03/14/2017 1042   BILITOT 0.3 03/14/2017 1042       Component Value Date/Time   WBC 7.2 03/14/2017 1042   RBC 5.18 (H) 03/14/2017 1042   HGB 13.1 03/14/2017 1042   HCT 40.9 03/14/2017 1042   PLT 280 03/14/2017 1042   MCV 79.0 (L) 03/14/2017 1042   MCH 25.3 (L) 03/14/2017 1042   MCHC 32.0 03/14/2017 1042   RDW 13.3 03/14/2017 1042   LYMPHSABS 1,865 03/14/2017 1042   EOSABS 367 03/14/2017 1042   BASOSABS 86 03/14/2017 1042    No results found for: POCLITH, LITHIUM   No results found for: PHENYTOIN, PHENOBARB, VALPROATE, CBMZ   .res Asse2ssment: Plan:    PTSD (post-traumatic stress disorder) - Plan: sertraline (ZOLOFT) 100 MG tablet, FLUoxetine (PROZAC) 20 MG capsule, busPIRone (BUSPAR) 30 MG tablet  Panic disorder with agoraphobia - Plan: sertraline (ZOLOFT) 100 MG tablet, FLUoxetine (PROZAC) 20 MG capsule  Recurrent major depression in full  remission ([place]) - Plan: FLUoxetine (PROZAC) 20 MG capsule, buPROPion (WELLBUTRIN XL) 300 MG 24 hr tablet  History of ADD  Good response to meds. She  lost her mother last year but can't cry.  She is wondering if the meds may be interfering with normal tearfulness or could possibly complicate her grief reaction.  She is open to trying a lower dose of sertraline.  We discussed at length how sertraline can sometimes have somewhat of a numbing effect.  However she has not typically experience that effect.  Discussed that this is dose related.  She reports each she has been stable on a lower dose of sertraline 150 mgbut still can't cry.  Has not been having recent panic or anxiety or depressive symptoms.  Discussed the risk of relapse with reduction or change in sertraline.    Anxiety and depression are under good control.  Buspirone in particular helps anxiety.  She notices more when she tends to run out.  BC blunted with sertraline:  Reduce sertraline to 1 tablet daily for 5 days, then start fluoxetine 1 daily and reduce sertraline to 1/2 tablet for 1 week, then stop sertraline and continue 1 fluoxetine daily Continue buspirone 30 mg twice daily for PTSD and augmentation for depression Continue Wellbutrin XL 300 mg a.m. for depression and a history of ADD symptoms  Call if the change in meds leads to more depression or anxiety.  We are not using stimulants at this time for ADD because of their tendency to make her anxious.  Follow-up 3 months  Lynder Parents, MD, DFAPA  Please see After Visit Summary for patient specific instructions.  No future appointments.  No orders of the defined types were placed in this encounter.     -------------------------------

## 2021-07-12 NOTE — Patient Instructions (Signed)
Reduce sertraline to 1 tablet daily for 5 days, then start fluoxetine 1 daily and reduce sertraline to 1/2 tablet for 1 week, then stop sertraline and continue 1 fluoxetine daily

## 2021-07-13 NOTE — Telephone Encounter (Signed)
Pt has an appt 4/18

## 2021-07-24 ENCOUNTER — Other Ambulatory Visit: Payer: Self-pay | Admitting: Psychiatry

## 2021-07-24 DIAGNOSIS — F4001 Agoraphobia with panic disorder: Secondary | ICD-10-CM

## 2021-07-24 DIAGNOSIS — F4310 Post-traumatic stress disorder, unspecified: Secondary | ICD-10-CM

## 2021-09-07 ENCOUNTER — Telehealth: Payer: Self-pay | Admitting: Psychiatry

## 2021-09-07 NOTE — Telephone Encounter (Signed)
Christy Adams called today at 10:45am reporting that she is having a difficult time.  She doesn't feel her medication is working for her.  Her anxiety is high.  She gets stuck and just can't seem to push through.  She would like to go back to Sertraline. Please call to discuss.  Appt 09/28/21 but she doesn't want to wait until the appt.  [phone] ?

## 2021-09-07 NOTE — Telephone Encounter (Signed)
We switched from sertraline 2 months ago.  Her anxiety is worse on the fluoxetine.  This is a known risk and I agree with her that switching back to sertraline makes the most sense.  However reduce fluoxetine to 20 mg daily or 40 mg every other day and start sertraline 1/2 tablet of the 100 mg tablet size for 5 days, then continue fluoxetine 20 mg daily and increase sertraline to 1 daily for 5 days, then stop fluoxetine and increase sertraline to 1-1/2 daily.  Please send in a prescription for sertraline 100 mg tablets 1-1/2 daily #45 and 1 refill ?

## 2021-09-07 NOTE — Telephone Encounter (Signed)
LVM to rtc 

## 2021-09-07 NOTE — Telephone Encounter (Signed)
Pt stated she is taking prozac 40 mg daily.She is not able to function and stated anxiety was not high on sertraline.She stated she would rather go back on it then start a new med

## 2021-09-08 NOTE — Telephone Encounter (Signed)
Still unable to reach pt

## 2021-09-09 ENCOUNTER — Other Ambulatory Visit: Payer: Self-pay

## 2021-09-09 DIAGNOSIS — F4310 Post-traumatic stress disorder, unspecified: Secondary | ICD-10-CM

## 2021-09-09 DIAGNOSIS — F4001 Agoraphobia with panic disorder: Secondary | ICD-10-CM

## 2021-09-09 MED ORDER — SERTRALINE HCL 100 MG PO TABS: 150.0000 mg | ORAL_TABLET | Freq: Every day | ORAL | 1 refills | Status: DC

## 2021-09-09 NOTE — Telephone Encounter (Signed)
Sent pt message via mychart

## 2021-09-12 NOTE — Telephone Encounter (Signed)
Please see message thread.Pt wants to contact you through mychart about her daughter

## 2021-09-12 NOTE — Telephone Encounter (Signed)
I sent her a note in [facility]'s chart ?

## 2021-09-17 ENCOUNTER — Telehealth: Payer: Self-pay | Admitting: Physician Assistant

## 2021-09-17 ENCOUNTER — Other Ambulatory Visit: Payer: Self-pay

## 2021-09-17 ENCOUNTER — Emergency Department (HOSPITAL_COMMUNITY)
Admission: EM | Admit: 2021-09-17 | Discharge: 2021-09-17 | Disposition: A | Discharge status: Home / Self Care | Payer: 59 | Attending: Emergency Medicine | Admitting: Emergency Medicine

## 2021-09-17 ENCOUNTER — Encounter (HOSPITAL_COMMUNITY): Payer: Self-pay | Admitting: Emergency Medicine

## 2021-09-17 ENCOUNTER — Ambulatory Visit (HOSPITAL_COMMUNITY)
Admission: EM | Admit: 2021-09-17 | Discharge: 2021-09-17 | Disposition: A | Discharge status: Home / Self Care | Payer: 59

## 2021-09-17 DIAGNOSIS — F411 Generalized anxiety disorder: Secondary | ICD-10-CM

## 2021-09-17 DIAGNOSIS — F419 Anxiety disorder, unspecified: Secondary | ICD-10-CM | POA: Diagnosis present

## 2021-09-17 DIAGNOSIS — T40711A Poisoning by cannabis, accidental (unintentional), initial encounter: Secondary | ICD-10-CM | POA: Diagnosis not present

## 2021-09-17 MED ORDER — LORAZEPAM 1 MG PO TABS
0.5000 mg | ORAL_TABLET | Freq: Once | ORAL | Status: AC
  Administered 2021-09-17: 0.5 mg via ORAL
  Filled 2021-09-17: qty 1

## 2021-09-17 MED ORDER — LORAZEPAM 0.5 MG PO TABS: 0.5000 mg | ORAL_TABLET | Freq: Four times a day (QID) | ORAL | 0 refills | Status: DC | PRN

## 2021-09-17 NOTE — Progress Notes (Signed)
?   09/17/21 1519  ?[facility] Triage Screening (Walk-ins at [facility] only)  ?How Did You Hear About [place]? Self  ?What Is the Reason for Your Visit/Call Today? Pt is a 51 yo female who presented voluntarily and accompanied by her sister, Lanier Ensign. Pt gave verbal permission for her to be present and participate in the assessment. Pt reported that she has a hx of psych issues but yesterday her anxiety symptoms became uncontrollable after consuming a [employer] affairs [employer] at [employer]. Pt stated she bought it from a vendor and "watched him open it" so contamination is not suspected. Since that time, pt reported experiencing extreme paranoia and fear. Pt does have a long hx of panic d/o but she stated "nothing like this."  Pt stateds that she is "so scared" as she was holding her chest and rockking at times. Pt denied that she was having a panic attack although she has a hx of panic d/o. Pt reported that she is "transitioning" some of her prescribed medications under the supervision of Dr. Lynder Parents. Pt stated that her OP therapist retired last year and she has not initiated services with any other therapist. Hx of MDD, panic d/o, agoraphobia and PTSD per pt's chart. Pt denied SI, HI, NSSH, AVH but reported paranoia. Pt stated, with her sister's help, that she is extremeley afraid that she will "lose her kids" and "lost her law license" with no reason to believe either of those things per her sister. Pt stated she realizes that they are not true but still feels "so so scared."  Pt is a practicing attorney and stated "I've been fine and giving people advise." Pt is Routine.  ?How Long Has This Been Causing You Problems? <Week  ?Have You Recently Had Any Thoughts About Hurting Yourself? No  ?Are You Planning to Commit Suicide/Harm Yourself At This time? No  ?Have you Recently Had Thoughts About [place]? No  ?Are You Planning To Harm Someone At This Time? No  ?Are you currently experiencing any auditory, visual or  other hallucinations? No  ?Have You Used Any Alcohol or Drugs in the Past 24 Hours? No  ?Do you have any current medical co-morbidities that require immediate attention? No  ?Clinician description of patient physical appearance/behavior: Pt appeared anxious and slightly manic in her presentation. Pt was cooperative, alert and seemed fully oriented. Pt's speech was somewhat pressured at times. Pt was casually dressed and appeared adequately groomed. Pt did not appear to be responding to internal stimuli. Pt's thinking, although paranoid, did not seem delusional as she stated she new it was not true. Pt's mood was histrionic and anxious and her affect was anxious and dramatic. Pt's judgment and insight both seemed fair to poor.  ?What Do You Feel Would Help You the Most Today? Stress Management  ?If access to [facility] was not available, would you have sought care in the Emergency Department? Yes  ?Determination of Need Routine (7 days) ?(Per Ricky Ala NP, pt is psychiatrically cleared at this time.)  ?Options For Referral Outpatient Therapy  ? ?Elijan Googe T. Mare Ferrari, [place], [facility], [place] ?Triage Specialist ?[place] ? ?

## 2021-09-17 NOTE — ED Triage Notes (Signed)
Pt here with her sister, who reports that pt drank a delta 8 seltzer last night, pt reports feeling anxious, is tearful, and hyperventilation.  ?

## 2021-09-17 NOTE — ED Notes (Signed)
Pt states she is feeling better and would like to be discharged.  ?Pt able to eat and drink without having n/v  ? ?Pt has even non-labored respirations and is able to speak in clear full sentences.  ?

## 2021-09-17 NOTE — Discharge Instructions (Addendum)
Today you received medications that may make you sleepy or impair your ability to make decisions.  For the next 24 hours please do not drive, operate heavy machinery, care for a small child with out another adult present, or perform any activities that may cause harm to you or someone else if you were to fall asleep or be impaired.  ? ?You are being prescribed a medication which may make you sleepy. Please follow up of listed precautions for at least 24 hours after taking one dose. ? ?If you develop any new or concerning symptoms please do not hesitate to seek additional medical care and evaluation. ?

## 2021-09-17 NOTE — Telephone Encounter (Signed)
Christy Adams, her sister called on-call service. [name] drank a D 8 Seltzer last night. Never had one before. Unclear when sx started, but patient has been crying off and on all day, is very agitated, saying things like 'they're going to take my kids away, I'll never get to see them again,' which isn't even an issue. Afraid she's dying. No AH/VH. Unknown SI/HI but her sister doesn't think so. She was going to hand the phone to [place], but I could hear her crying more in the background saying something like she was scared and didn't want to talk to me.  ?Only med change, about 1.5 weeks ago, (phone note 09/07/2021.) on Zoloft 150 mg now, off Prozac. ?Recommend she go to [facility] d/t acute paranoia and agitation.  Address given to [facility]. ?

## 2021-09-17 NOTE — ED Provider Notes (Signed)
?[facility] ?Provider Note ? ? ?CSN: [id number] ?Arrival date & time: 09/17/21  1530 ? ?  ? ?History ? ?Chief Complaint  ?Patient presents with  ? Anxiety  ? ? ?Christy Adams is a 51 y.o. female with a past medical history of anxiety, depression, panic attacks, who presents today for evaluation of anxiety. ?She states that yesterday at about 6 PM she had a seltzer containing delta 8.  It reportedly had about 20 mg.  He states that she watched the cell or open it and is not concerned about any other drugs in the seltzer.  She states that since then she is still feeling the effects of it and it makes her feel very anxious.  She is having a difficult time calming herself per report from her and her sister.  She denies any other substances.  She was seen at behavioral health urgent care and discharged. ?She denies any SI, HI, or AVH.  She feels like she is having times where she is not sure if she is in reality or not. ?She expresses anger that this is legal.  She denies any pain or headache.  She denies any trauma or injury. ?She does not regularly use any THC or cannabis.  She states that she has tried cannabis only once before. ?HPI ? ?  ? ?Home Medications ?Prior to Admission medications   ?Medication Sig Start Date End Date Taking? Authorizing Provider  ?LORazepam (ATIVAN) 0.5 MG tablet Take 1 tablet (0.5 mg total) by mouth every 6 (six) hours as needed for anxiety. 09/17/21  Yes Lorin Glass, PA-C  ?buPROPion (WELLBUTRIN XL) 300 MG 24 hr tablet Take 1 tablet (300 mg total) by mouth daily. 07/12/21   Cottle, Billey Co., MD  ?busPIRone (BUSPAR) 30 MG tablet Take 1 tablet (30 mg total) by mouth at bedtime. 07/12/21   Cottle, Billey Co., MD  ?ergocalciferol (VITAMIN D2) 1.25 MG (50000 UT) capsule Take 50,000 Units by mouth once a week. ?Patient not taking: Reported on 07/12/2021    [provider]  ?FLUoxetine (PROZAC) 20 MG capsule Take 1 capsule (20 mg total)  by mouth daily. 07/12/21   Cottle, Billey Co., MD  ?montelukast (SINGULAIR) 10 MG tablet TK 1 T PO QHS 02/05/19   [provider]  ?sertraline (ZOLOFT) 100 MG tablet Take 1.5 tablets (150 mg total) by mouth daily. TAKE 2 TABLETS(200 MG) BY MOUTH DAILY 09/09/21   Cottle, Billey Co., MD  ?   ? ?Allergies    ?Patient has no known allergies.   ? ?Review of Systems   ?Review of Systems ? ?Physical Exam ?Updated Vital Signs ?BP 111/63 (BP Location: Right Arm)   Pulse 72   Temp 98.8 ?F (37.1 ?C) (Oral)   Resp 18   SpO2 94%  ?Physical Exam ?Vitals and nursing note reviewed.  ?Constitutional:   ?   General: She is not in acute distress. ?   Appearance: She is not ill-appearing.  ?HENT:  ?   Head: Normocephalic and atraumatic.  ?Eyes:  ?   Conjunctiva/sclera: Conjunctivae normal.  ?Cardiovascular:  ?   Rate and Rhythm: Normal rate and regular rhythm.  ?   Pulses: Normal pulses.  ?   Heart sounds: Normal heart sounds.  ?Pulmonary:  ?   Effort: Pulmonary effort is normal. No respiratory distress.  ?   Breath sounds: Normal breath sounds.  ?Abdominal:  ?   General: There is no distension.  ?  Musculoskeletal:     ?   General: No deformity.  ?   Cervical back: Normal range of motion and neck supple.  ?   Comments: No obvious acute injury  ?Skin: ?   General: Skin is warm and dry.  ?Neurological:  ?   Mental Status: She is alert.  ?   Comments: Awake and alert, answers all questions appropriately.  Speech is not slurred.  5/5 grip strength bilaterally.  5/5 strength in bilateral upper and lower extremities.  Facial movements are symmetric.  ?Psychiatric:  ?   Comments: Patient is anxious and tearful intermittently.  She is cooperative.  She does not appear to be responding to internal stimuli.  ? ? ?ED Results / Procedures / Treatments   ?Labs ?(all labs ordered are listed, but only abnormal results are displayed) ?Labs Reviewed - No data to display ? ?EKG ?None ? ?Radiology ?No results found. ? ?Procedures ?Procedures   ? ? ?Medications Ordered in ED ?Medications  ?LORazepam (ATIVAN) tablet 0.5 mg (0.5 mg Oral Given 09/17/21 1709)  ? ? ?ED Course/ Medical Decision Making/ A&P ?Clinical Course as of 09/17/21 2332  ?Sat Sep 17, 2021  ?1904 Patient is reevaluated.  She is calm, she feels like her episodes of breaking from reality have resolved and she wants to go home and sleep at this time.  She tolerated p.o. intake without difficulty.  Her sister is at bedside who confirms that she will be able to be with her tonight. ?We did discuss option for prescription of 1-2 extra doses of Ativan for home as needed.  We discussed risks and benefits of this option. [EH]  ?  ?Clinical Course User Index ?[EH] Lorin Glass, PA-C  ? ?                        ?Medical Decision Making ?Patient is a 51 year old who presents today for evaluation after she ingested about 20 mg of delta 8 THC and a seltzer per her report yesterday at 6 PM. ?She originally went to behavioral health urgent care, was felt safe for discharge and discharge. ?She tells me that she still feels very anxious on my initial evaluation and that she feels like she is slipping in and out of reality. ?Her physical exam is reassuring and she is anxious however otherwise neurovascularly intact. ?She was observed in the emergency room for 3-1/2 hours.  During that point she was given Ativan, and p.o. challenged.  After that she felt much better, she felt like she was "back in reality." ?We discussed avoiding delta 8 or other THC based products. ?Her sister is with her and states she will stay with her through the night. ?She has given precautions when she got Ativan and states her understanding. ?Shared decision making with patient and her sister will give prescription for 2 doses of low-dose oral Ativan in case her symptoms persist.  [facility] PMP is consulted prior to this prescription. ?Note provided not to fill after 1 week from today, and patient states she will only get  it filled if needed. ? ?Prior to discharge patient is reassessed and remains neurovascularly intact and in no distress. ? ?Return precautions were discussed with patient who states their understanding.  At the time of discharge patient denied any unaddressed complaints or concerns.  Patient is agreeable for discharge home. ? ?Note: Portions of this report may have been transcribed using voice recognition software. Every effort was made to  ensure accuracy; however, inadvertent computerized transcription errors may be present ? ? ? ?Problems Addressed: ?Accidental cannabis overdose, initial encounter: acute illness or injury with systemic symptoms ?Anxiety: chronic illness or injury with exacerbation, progression, or side effects of treatment ? ?Amount and/or Complexity of Data Reviewed ?Independent Historian:  ?   Details: Sister ?Labs:  ?   Details: Considered however are not indicated at this time ?Radiology:  ?   Details: CT head considered, however as patient is neurologically intact at the time of discharge is not indicated. ?ECG/medicine tests: ordered. ? ?Risk ?Prescription drug management. ?Decision regarding hospitalization. ?Risk Details: PO controlled substances, prescription for controlled substances ? ? ? ?Final Clinical Impression(s) / ED Diagnoses ?Final diagnoses:  ?Anxiety  ?Accidental cannabis overdose, initial encounter  ? ? ?Rx / DC Orders ?ED Discharge Orders   ? ?      Ordered  ?  LORazepam (ATIVAN) 0.5 MG tablet  Every 6 hours PRN       ?Note to Pharmacy: Do not fill after 09/22/21  ? 09/17/21 1912  ? ?  ?  ? ?  ? ? ?  ?Lorin Glass, [place] ?09/17/21 2332 ? ?  ?Lorelle Gibbs, DO ?09/18/21 1130 ? ?

## 2021-09-17 NOTE — ED Provider Notes (Signed)
Behavioral Health Urgent Care Medical Screening Exam ? ?Patient Name: Christy Adams ?MRN: [id number] ?Date of Evaluation: 09/17/21 ?Chief Complaint:   " anxious and edgy after drinking delta."  ?Diagnosis:  ?Final diagnoses:  ?GAD (generalized anxiety disorder)  ? ? ?History of Present illness: Christy Adams is a 51 y.o. female.  Presents to [facility] urgent care accompanied with her sister reports concerns with increased anxiety and paranoia after drinking a delta 8 drink.  States she was at [employer] last night and she order to drink and has been feeling anxious and edgy for the past 24 hours. "  I do not know what was mixed in the drink."  Christy Adams reports feeling "normal" yesterday. ? ?She denies suicidal or homicidal ideations.  Denies auditory visual hallucinations.  Reports she is currently followed by [facility] for medication management and therapy services.  States she has been taking medications as indicated.  ? ?During evaluation Christy Adams is sitting in no acute distress,histrionic and slightly pressured. She is alert/oriented x 4; calm/cooperative; and mood congruent with affect. She is speaking in a clear tone at moderate volume, and normal pace; with good eye contact. Her thought process is coherent and relevant; There is no indication that she is currently responding to internal/external stimuli or experiencing delusional thought content; and she has denied suicidal/self-harm/homicidal ideation, psychosis, and reported paranoia. " I don't want my sister to leave me."  Patient has remained calm throughout assessment and has answered questions appropriately.   ? ? ?At this time Christy Adams is educated and verbalizes understanding of mental health resources and other crisis services in the community. She is instructed to call 911 and present to the nearest emergency room should she experience any suicidal/homicidal ideation, auditory/visual/hallucinations, or  detrimental worsening of her mental health condition. She was a also advised by Probation officer that she could call the toll-free phone on insurance card to assist with identifying in network counselors and agencies or number on back of Medicaid card to speak with care coordinator.  ? ?Psychiatric Specialty Exam ? ?Presentation  ?General Appearance:Appropriate for Environment ? ?Eye Contact:Minimal ? ?Speech:Clear and Coherent ? ?Speech Volume:Normal ? ?Handedness:Right ? ? ?Mood and Affect  ?Mood:Anxious; Irritable ?Affect:Congruent ? ?Thought Process  ?Thought Processes:Coherent ?Descriptions of Associations:Intact ? ?Orientation:Full (Time, Place and Person) ? ?Thought Content:Logical ?   Hallucinations:None ? ?Ideas of Reference:None ? ?Suicidal Thoughts:No ? ?Homicidal Thoughts:No ? ? ?Sensorium  ?Memory:Recent Good; Remote Good ?Judgment:Fair ?Insight:Fair ? ?Executive Functions  ?Concentration:Fair ?Attention Span:Good ?Recall:Good ?[employer] [place] ?Language:Fair ? ?Psychomotor Activity  ?Psychomotor Activity:Normal ? ?Assets  ?Assets:Communication Skills; Social Support; Physical Health ? ?Sleep  ?Sleep:Fair ?Number of hours: No data recorded ? ?Nutritional Assessment (For OBS and FBC admissions only) ?Has the patient had a weight loss or gain of 10 pounds or more in the last 3 months?: No ?Has the patient had a decrease in food intake/or appetite?: No ?Does the patient have dental problems?: No ?Does the patient have eating habits or behaviors that may be indicators of an eating disorder including binging or inducing vomiting?: No ?Has the patient recently lost weight without trying?: 0 ?Has the patient been eating poorly because of a decreased appetite?: 0 ?Malnutrition Screening Tool Score: 0 ? ? ? ?Physical Exam: ?Physical Exam ?Vitals reviewed.  ?Cardiovascular:  ?   Rate and Rhythm: Normal rate and regular rhythm.  ?Neurological:  ?   Mental Status: She is alert.  ?Psychiatric:     ?  Attention and  Perception: Attention and perception normal.     ?   Mood and Affect: Mood is anxious.     ?   Speech: Speech normal.     ?   Behavior: Behavior is cooperative.     ?   Thought Content: Thought content normal.     ?   Cognition and Memory: Cognition normal.  ? ?Review of Systems  ?Constitutional: Negative.   ?Eyes: Negative.   ?Cardiovascular: Negative.   ?Gastrointestinal: Negative.   ?Genitourinary: Negative.   ?Skin: Negative.   ?Neurological: Negative.   ?Endo/Heme/Allergies: Negative.   ?Psychiatric/Behavioral:  Positive for substance abuse. Negative for depression and suicidal ideas. The patient is nervous/anxious.   ?All other systems reviewed and are negative. ?Blood pressure 121/83, pulse 79, temperature 98.2 ?F (36.8 ?C), temperature source Oral, resp. rate 19, SpO2 98 %. There is no height or weight on file to calculate BMI. ? ?Musculoskeletal: ?Strength & Muscle Tone: within normal limits ?Gait & Station: normal ?Patient leans: N/A ? ? ?[facility] MSE Discharge Disposition for Follow up and Recommendations: ?Based on my evaluation the patient does not appear to have an emergency medical condition and can be discharged with resources and follow up care in outpatient services for Partial Hospitalization Program ? ? ?Derrill Center, NP ?09/17/2021, 3:32 PM ? ?

## 2021-09-17 NOTE — Discharge Instructions (Signed)
Take all medications as prescribed. Keep all follow-up appointments as scheduled.  Do not consume alcohol or use illegal drugs while on prescription medications. Report any adverse effects from your medications to your primary care provider promptly.  In the event of recurrent symptoms or worsening symptoms, call 911, a crisis hotline, or go to the nearest emergency department for evaluation.   

## 2021-09-19 NOTE — Telephone Encounter (Signed)
Patient said she bought the drink at [facility]. She did go to the ER and was given Ativan. She was fine after sleeping it off.  ?

## 2021-09-19 NOTE — Telephone Encounter (Signed)
Delta 8 can cause psychosis and is probably the cause.  Please call pt and ensure it has resolved.  I'm sure she knows not to use that again . ?

## 2021-09-19 NOTE — Telephone Encounter (Signed)
Noted thanks °

## 2021-09-20 NOTE — Telephone Encounter (Signed)
reviewed

## 2021-09-28 ENCOUNTER — Ambulatory Visit: Payer: 59 | Admitting: Psychiatry

## 2021-10-11 ENCOUNTER — Ambulatory Visit: Payer: 59 | Admitting: Psychiatry

## 2021-10-11 ENCOUNTER — Telehealth (INDEPENDENT_AMBULATORY_CARE_PROVIDER_SITE_OTHER): Payer: 59 | Admitting: Psychiatry

## 2021-10-11 ENCOUNTER — Encounter: Payer: Self-pay | Admitting: Psychiatry

## 2021-10-11 DIAGNOSIS — F4310 Post-traumatic stress disorder, unspecified: Secondary | ICD-10-CM

## 2021-10-11 DIAGNOSIS — F3342 Major depressive disorder, recurrent, in full remission: Secondary | ICD-10-CM

## 2021-10-11 DIAGNOSIS — F4001 Agoraphobia with panic disorder: Secondary | ICD-10-CM

## 2021-10-11 MED ORDER — BUPROPION HCL ER (XL) 300 MG PO TB24: 300.0000 mg | ORAL_TABLET | Freq: Every day | ORAL | 1 refills | Status: DC

## 2021-10-11 MED ORDER — BUSPIRONE HCL 30 MG PO TABS: 30.0000 mg | ORAL_TABLET | Freq: Every day | ORAL | 1 refills | Status: DC

## 2021-10-11 MED ORDER — SERTRALINE HCL 100 MG PO TABS: 150.0000 mg | ORAL_TABLET | Freq: Every day | ORAL | 1 refills | Status: DC

## 2021-10-11 NOTE — Progress Notes (Signed)
Christy Adams ?[id number] ?1970/07/03 ?51 y.o.  ? ?Video Visit via My Chart ? ?I connected with pt by My Chart and verified that I am speaking with the correct person using two identifiers. ?  ?I discussed the limitations, risks, security and privacy concerns of performing an evaluation and management service by My Chart  and the availability of in person appointments. I also discussed with the patient that there may be a patient responsible charge related to this service. The patient expressed understanding and agreed to proceed. ? ?I discussed the assessment and treatment plan with the patient. The patient was provided an opportunity to ask questions and all were answered. The patient agreed with the plan and demonstrated an understanding of the instructions. ?  ?The patient was advised to call back or seek an in-person evaluation if the symptoms worsen or if the condition fails to improve as anticipated. ? ?I provided 30 minutes of video time during this encounter.  The patient was located at home and the provider was located office. ?Session from 230-300  ? ?Subjective:  ? ?Patient ID:  Christy Adams is a 51 y.o. (DOB Nov 18, 1970) female. ? ?Chief Complaint:  ?Chief Complaint  ?Patient presents with  ? Follow-up  ? Anxiety  ? ? ?HPI ?Christy Adams presents to the office today for follow-up of anxiety and depression. ? ?visit August 20, 2018 & 02/2019.  No meds were changed. ? ?08/24/2020 appt noted: ? Mother died August 08, 2019 from massive stroke.  Hemorrhagic. ?Can't cry very easily.  Wonders if sertraline interferes.  Wonders if it interferes with grief and she'll be having more problems later.  Doesn't feel like she's holding emotions back.   ?Has not felt flat before and has felt good emotionally.  Anxiety managed.   ?Good job supportive. ?Son to college in fall. ?Sisters and mother also on sertraline and Wellbutrin. ?pLAN: OK trial reduction in sertraline to 150 mg to see if grief tears are easier to come and  history of stability in past on 150 mg daily. ?Continue buspirone 30 mg twice daily for PTSD and augmentation for depression ?Continue Wellbutrin XL 300 mg a.m. for depression and a history of ADD symptoms ? ?10/20/20 TC:  wanted med change.  Increased sertraline back to 200 mg daily. ? ?07/12/2021 appt noted: ?Has been trying to stick with sertraline 150 mg daily.  Occ misses a day. ?Has been runnin gout of buspar and didn't feel good. ?On way hand function well.  Hard year bc losing mother.   ?Still don't cry easily on sertraline and doesn't like it. ?Restarting therapy. ?Overall feels ok with meds. ?No sig anxiety but a lot of emotional things and I feel detached. ?Not hlpeless.  Bluntness can be surprising. ?Sister health issues.  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 ?Plan: BC blunted with sertraline:  ?Reduce sertraline to 1 tablet daily for 5 days, then start fluoxetine 1 daily and reduce sertraline to 1/2 tablet for 1 week, then stop sertraline and continue 1 fluoxetine daily ?Continue buspirone 30 mg twice daily for PTSD and augmentation for depression ?Continue Wellbutrin XL 300 mg a.m. for depression and a history of ADD symptoms ? ?09/07/2021 phone call complaining that anxiety is worse on fluoxetine and she will go back to sertraline. ?MD response:We switched from sertraline 2 months ago.  Her anxiety is worse on the fluoxetine.  This is a known risk and I agree with her that switching back to sertraline makes the most sense.  However  reduce fluoxetine to 20 mg daily or 40 mg every other day and start sertraline 1/2 tablet of the 100 mg tablet size for 5 days, then continue fluoxetine 20 mg daily and increase sertraline to 1 daily for 5 days, then stop fluoxetine and increase sertraline to 1-1/2 daily.  Please send in a prescription for sertraline 100 mg tablets 1-1/2 daily #45 and 1 refill ? ?10/11/21 appt noted: ?Switched back to sertraline and 90% better.  More willing to put up with the flat SE from  sertraline.  Anxiety unmanageable with fluoxetine.  On sertraline 150 mg daily.  Astonishing how much better I am.  Felt terrible on fluoxetine. ?Patient reports stable mood and denies depressed or irritable moods.  Patient denies any recent difficulty with anxiety.  Patient denies difficulty with sleep initiation or maintenance. Denies appetite disturbance.  Patient reports that energy and motivation have been good.  Patient denies any difficulty with concentration.  Patient denies any suicidal ideation. ? ?Past Psychiatric Medication Trials: Sertraline 200 (on Zoloft 20 years) ?fluoxetine no response in early 20's, retry 2023 NR after 6 weeks and anxiety unmanageable  ? Wellbutrin XL 300,  ?buspirone 30 twice daily,  ?clonazepam, lorazepam,  ?Stimulants anxiety ? ?Review of Systems:  ?Review of Systems  ?Respiratory:  Positive for cough. Negative for shortness of breath.   ?Cardiovascular:  Negative for chest pain and palpitations.  ?Neurological:  Negative for tremors.  ?Psychiatric/Behavioral:  Negative for agitation, behavioral problems, confusion, decreased concentration, dysphoric mood, hallucinations, self-injury, sleep disturbance and suicidal ideas. The patient is not nervous/anxious and is not hyperactive.   ? ?Medications: I have reviewed the patient's current medications. ? ?Current Outpatient Medications  ?Medication Sig Dispense Refill  ? LORazepam (ATIVAN) 0.5 MG tablet Take 1 tablet (0.5 mg total) by mouth every 6 (six) hours as needed for anxiety. 2 tablet 0  ? montelukast (SINGULAIR) 10 MG tablet TK 1 T PO QHS    ? buPROPion (WELLBUTRIN XL) 300 MG 24 hr tablet Take 1 tablet (300 mg total) by mouth daily. 90 tablet 1  ? busPIRone (BUSPAR) 30 MG tablet Take 1 tablet (30 mg total) by mouth at bedtime. 180 tablet 1  ? ergocalciferol (VITAMIN D2) 1.25 MG (50000 UT) capsule Take 50,000 Units by mouth once a week. (Patient not taking: Reported on 07/12/2021)    ? sertraline (ZOLOFT) 100 MG tablet Take 1.5  tablets (150 mg total) by mouth daily. 135 tablet 1  ? ?No current facility-administered medications for this visit.  ? ? ?Medication Side Effects: None ? ?Allergies: No Known Allergies ? ?Past Medical History:  ?Diagnosis Date  ? Abnormal Pap smear of cervix   ? Allergy   ? Anemia   ? Anxiety   ? Asthma   ? allergic to dust - only uses inhaler prn - rarely  ? Depression   ? Headache   ? otc med prn - rare  ? HPV in female   ? Panic attacks   ? PONV (postoperative nausea and vomiting)   ? Sleep apnea   ? no CPAP  ? SVD (spontaneous vaginal delivery)   ? x 3  ? ? ?Family History  ?Problem Relation Age of Onset  ? Heart disease Mother   ? Depression Mother   ? Hypertension Mother   ? Thyroid disease Mother   ? Heart attack Father 70  ? Heart failure Maternal Grandmother   ? Diabetes Maternal Grandfather   ? Heart attack Maternal Grandfather   ?  Psoriasis Sister   ? ? ?Social History  ? ?Socioeconomic History  ? Marital status: Divorced  ?  Spouse name: Not on file  ? Number of children: Not on file  ? Years of education: Not on file  ? Highest education level: Not on file  ?Occupational History  ? Not on file  ?Tobacco Use  ? Smoking status: Never  ? Smokeless tobacco: Never  ?Vaping Use  ? Vaping Use: Never used  ?Substance and Sexual Activity  ? Alcohol use: Yes  ?  Comment: wine at night 2-3 days per week.   ? Drug use: No  ? Sexual activity: Not Currently  ?  Partners: Male  ?  Comment: ablation and BTL  ?Other Topics Concern  ? Not on file  ?Social History Narrative  ? Not on file  ? ?Social Determinants of Health  ? ?Financial Resource Strain: Not on file  ?Food Insecurity: Not on file  ?Transportation Needs: Not on file  ?Physical Activity: Not on file  ?Stress: Not on file  ?Social Connections: Not on file  ?Intimate Partner Violence: Not on file  ? ? ?Past Medical History, Surgical history, Social history, and Family history were reviewed and updated as appropriate.  ? ?Please see review of systems for  further details on the patient's review from today.  ? ?Objective:  ? ?Physical Exam:  ?There were no vitals taken for this visit. ? ?Physical Exam ?Constitutional:   ?   General: She is not in acute distress. ?Mu

## 2021-11-16 ENCOUNTER — Other Ambulatory Visit (HOSPITAL_COMMUNITY): Payer: Self-pay | Admitting: Gastroenterology

## 2021-11-16 ENCOUNTER — Other Ambulatory Visit: Payer: Self-pay | Admitting: Gastroenterology

## 2021-11-16 DIAGNOSIS — D490 Neoplasm of unspecified behavior of digestive system: Secondary | ICD-10-CM

## 2021-11-16 DIAGNOSIS — C189 Malignant neoplasm of colon, unspecified: Secondary | ICD-10-CM

## 2021-11-18 ENCOUNTER — Encounter: Payer: Self-pay | Admitting: *Deleted

## 2021-11-18 ENCOUNTER — Ambulatory Visit (HOSPITAL_BASED_OUTPATIENT_CLINIC_OR_DEPARTMENT_OTHER)
Admission: RE | Admit: 2021-11-18 | Discharge: 2021-11-18 | Disposition: A | Discharge status: Home / Self Care | Payer: 59 | Source: Ambulatory Visit | Attending: Gastroenterology | Admitting: Gastroenterology

## 2021-11-18 DIAGNOSIS — C189 Malignant neoplasm of colon, unspecified: Secondary | ICD-10-CM | POA: Diagnosis present

## 2021-11-18 DIAGNOSIS — D490 Neoplasm of unspecified behavior of digestive system: Secondary | ICD-10-CM | POA: Insufficient documentation

## 2021-11-18 MED ORDER — IOHEXOL 300 MG/ML  SOLN
100.0000 mL | Freq: Once | INTRAMUSCULAR | Status: AC | PRN
  Administered 2021-11-18: 100 mL via INTRAVENOUS

## 2021-11-18 NOTE — Progress Notes (Signed)
PATIENT NAVIGATOR PROGRESS NOTE  Name: Christy Adams Date: 11/18/2021 MRN: [id number]  DOB: 1970-12-29   Reason for visit:  Introductory phone call  Comments:  Spoke with pt and she is scheduled with [facility] on 5/31 and will see Dr Benay Spice on 6/7 at 1:10. Directions to building and parking reviewed. Visitor and mask policy reviewed as well    Time spent counseling/coordinating care: > 60 minutes

## 2021-11-30 ENCOUNTER — Inpatient Hospital Stay: Payer: 59 | Attending: Oncology | Admitting: Oncology

## 2021-11-30 VITALS — BP 119/85 | HR 70 | Temp 99.1°F | Resp 18 | Ht 66.0 in | Wt 211.8 lb

## 2021-11-30 DIAGNOSIS — Z803 Family history of malignant neoplasm of breast: Secondary | ICD-10-CM

## 2021-11-30 DIAGNOSIS — F32A Depression, unspecified: Secondary | ICD-10-CM | POA: Diagnosis not present

## 2021-11-30 DIAGNOSIS — R911 Solitary pulmonary nodule: Secondary | ICD-10-CM | POA: Diagnosis not present

## 2021-11-30 DIAGNOSIS — C187 Malignant neoplasm of sigmoid colon: Secondary | ICD-10-CM | POA: Diagnosis not present

## 2021-11-30 DIAGNOSIS — C189 Malignant neoplasm of colon, unspecified: Secondary | ICD-10-CM | POA: Insufficient documentation

## 2021-11-30 DIAGNOSIS — J302 Other seasonal allergic rhinitis: Secondary | ICD-10-CM | POA: Diagnosis not present

## 2021-11-30 NOTE — Progress Notes (Signed)
[name]   Requesting MD: Otis Brace, Md [place],  [place] [id number]   Christy Adams 51 y.o.  [birth year]    Reason for Consult: Colon cancer   HPI: Christy Adams during a routine screening colonoscopy by Dr. Alessandra Bevels on 11/14/2021.  A polyp removed from the transverse colon .  A rectosigmoid polyp was not removed.  A nonobstructing mass was found in the rectosigmoid at 25 cm.  The mass was noncircumferential.  A biopsy was obtained and the area was tattooed.  The pathology revealed a tubular adenoma of the transverse colon.  The rectosigmoid biopsy returned as invasive moderately differentiated adenocarcinoma.  CTs of the chest, abdomen, and pelvis on 11/18/2021 revealed a single 3 mm calcified nodule in the right lower lobe with an additional punctate calcified nodule in the periphery of the right lower lobe.  No liver lesion.  Gallstones were noted in the gallbladder.  No enlarged abdominal pelvic lymph nodes.  A colon mass was not appreciated.  She was referred to Dr. Maxie Better at [facility] and is scheduled for a low anterior resection on 12/30/2021.  She feels well.  Past Medical History:  Diagnosis Date   Abnormal Pap smear of cervix    Allergy    Anemia    Anxiety    Asthma    allergic to dust - only uses inhaler prn - rarely   Depression    Headache    otc med prn - rare   HPV in female    Panic attacks    PONV (postoperative nausea and vomiting)    Sleep apnea    no CPAP   SVD (spontaneous vaginal delivery)    x 3    .  G3 P3  Past Surgical History:  Procedure Laterality Date   ABLATION  09/10/2009   COLPOSCOPY  05/21/2017   By [facility]    DILATION AND CURETTAGE OF UTERUS  09/10/2009   hysteroscopy with Novasure   excision of vaginal mole  01/10/2005   EYE SURGERY     strabismus, lens surgery   KNEE SURGERY     left   LAPAROSCOPIC TUBAL LIGATION Bilateral 09/14/2014   Procedure: LAPAROSCOPIC  TUBAL LIGATION;  Surgeon: Paula Compton, MD;  Location: [facility];  Service: Gynecology;  Laterality: Bilateral;   PERCUTANEOUS PINNING Left 01/27/2021   Procedure: Closed reduction and pinning of the left hallux proximal phalanx;  Surgeon: Wylene Simmer, MD;  Location: [facility];  Service: Orthopedics;  Laterality: Left;   TONSILLECTOMY     TUBAL LIGATION     wisedom teeth      Medications: Reviewed  Allergies: No Known Allergies  Family history: Her mother had breast cancer at age 77.  Her mother had colon polyps.  An aunt has cystic fibrosis.  Social History:   She lives with her children in [place].  She is a Chief Executive Officer.  She does not use cigarettes.  She reports social alcohol use.  No transfusion history.  ROS:   Positives include: Intermittent "sweats ", wheezing when she is exposed to dust, 1 episode of rectal bleeding 2 months ago  A complete ROS was otherwise negative.  Physical Exam:  Blood pressure 119/85, pulse 70, temperature 99.1 F (37.3 C), temperature source Oral, resp. rate 18, height 5' 6" (1.676 m), weight 211 lb 12.8 oz (96.1 kg), SpO2 99 %.  HEENT: Oral cavity without visible mass, neck without mass Lungs: Clear  bilaterally Cardiac: Regular rate and rhythm Abdomen: No hepatosplenomegaly, nontender, no mass  Vascular: No leg edema Lymph nodes: No cervical, supraclavicular, axillary, or inguinal nodes Neurologic: Alert and oriented, the motor exam appears intact in the upper and lower extremities bilaterally Skin: No rash Musculoskeletal: No spine tenderness   LAB:  CBC  Lab Results  Component Value Date   WBC 7.2 03/14/2017   HGB 13.1 03/14/2017   HCT 40.9 03/14/2017   MCV 79.0 (L) 03/14/2017   PLT 280 03/14/2017   NEUTROABS 4,320 03/14/2017        CMP  Lab Results  Component Value Date   NA 136 03/14/2017   K 4.4 03/14/2017   CL 104 03/14/2017   CO2 25 03/14/2017   GLUCOSE 94 03/14/2017   BUN 18 03/14/2017    CREATININE 0.83 03/14/2017   CALCIUM 9.4 03/14/2017   PROT 6.9 03/14/2017   AST 21 03/14/2017   ALT 28 03/14/2017   BILITOT 0.3 03/14/2017      Imaging: As per HPI, CT images from 11/18/2021 reviewed    Assessment/Plan:   Colon cancer-rectosigmoid Mass at 25 cm on colonoscopy 11/14/2021-moderately differentiated adenocarcinoma CTs 11/18/2021-no colon mass appreciated, no evidence of metastatic disease, single 0.3 cm right lower lobe nodule  : Polyps on colonoscopy 11/14/2021-tubular adenoma removed from the transverse colon Depression Allergies Mother with breast cancer and colon polyps   Disposition:   Christy Adams has been diagnosed with colon cancer.  She appears to have an early stage colon cancer based on the clinical evaluation today.  She understands the final staging will be based on the surgical pathology report.  She is scheduled for low anterior resection on 12/30/2021.  She will return for an office visit discussed the surgical pathology and make adjuvant treatment recommendations on 01/10/2022.  We will follow-up on mismatch repair protein testing on the 11/14/2021 colon mass biopsy.  She most likely does not have hereditary nonpolyposis colon cancer syndrome, but we will be sure the mismatch repair protein and MSI testing are completed.  We discussed the recommendation for colonoscopy surveillance following surgery.  She was noted to have gallstones on the staging CTs.  She plans to discuss the indication for a cholecystectomy with Dr. Maxie Better.    Betsy Coder, MD  11/30/2021, 3:42 PM

## 2021-12-06 ENCOUNTER — Encounter: Payer: Self-pay | Admitting: *Deleted

## 2021-12-06 NOTE — Progress Notes (Signed)
PATIENT NAVIGATOR PROGRESS NOTE  Name: Christy Adams Date: 12/06/2021 MRN: [id number]  DOB: 09/25/1970   Reason for visit:  Telephone call  Comments:  Called patient per Dr Sherrill to disucss MMR protein results and CEA results    Pt to have surgery on 7/7 and F/U with Dr Sherrill on 7/18  Encouraged patient to call with any questions or concerns    Time spent counseling/coordinating care: 30-45 minutes  

## 2022-01-04 ENCOUNTER — Encounter: Payer: Self-pay | Admitting: *Deleted

## 2022-01-06 ENCOUNTER — Encounter: Payer: Self-pay | Admitting: *Deleted

## 2022-01-06 NOTE — Progress Notes (Signed)
Called [facility] for path report from surgery 7/7 with Dr Maxie Better

## 2022-01-09 ENCOUNTER — Encounter: Payer: Self-pay | Admitting: Oncology

## 2022-01-10 ENCOUNTER — Inpatient Hospital Stay: Payer: 59 | Attending: Oncology | Admitting: Oncology

## 2022-01-10 ENCOUNTER — Encounter: Payer: Self-pay | Admitting: *Deleted

## 2022-01-10 ENCOUNTER — Telehealth: Payer: Self-pay | Admitting: Genetic Counselor

## 2022-01-10 ENCOUNTER — Encounter (INDEPENDENT_AMBULATORY_CARE_PROVIDER_SITE_OTHER): Payer: Self-pay

## 2022-01-10 ENCOUNTER — Other Ambulatory Visit: Payer: Self-pay | Admitting: *Deleted

## 2022-01-10 VITALS — BP 119/79 | HR 65 | Temp 98.2°F | Resp 18 | Ht 66.0 in | Wt 212.2 lb

## 2022-01-10 DIAGNOSIS — C19 Malignant neoplasm of rectosigmoid junction: Secondary | ICD-10-CM | POA: Insufficient documentation

## 2022-01-10 DIAGNOSIS — Z803 Family history of malignant neoplasm of breast: Secondary | ICD-10-CM | POA: Insufficient documentation

## 2022-01-10 DIAGNOSIS — C187 Malignant neoplasm of sigmoid colon: Secondary | ICD-10-CM

## 2022-01-10 DIAGNOSIS — D123 Benign neoplasm of transverse colon: Secondary | ICD-10-CM | POA: Diagnosis not present

## 2022-01-10 DIAGNOSIS — F32A Depression, unspecified: Secondary | ICD-10-CM | POA: Diagnosis not present

## 2022-01-10 NOTE — Progress Notes (Unsigned)
PATIENT NAVIGATOR PROGRESS NOTE  Name: Christy Adams Date: 01/10/2022 MRN: [id number]  DOB: 11-26-1970   Reason for visit:  F/U appt after surgery/pathology review  Comments:  Met with Christy Adams during visit with Dr Benay Spice MSI testing requested through [facility] off of resected tumor Referral to Dietician for nutritional consult after surgery Referral to Genetics for counseling Will return to clinic mid November for CT scan and labs and visit with Dr Benay Spice Reinforced contact information and to call with any issues or questions    Time spent counseling/coordinating care: > 60 minutes

## 2022-01-10 NOTE — Telephone Encounter (Signed)
Scheduled appt per 7/18 referral. Called pt, no answer and vm was full, unable to leave a vm. Mailed updated calendar to pt.

## 2022-01-10 NOTE — Progress Notes (Signed)
  [place] OFFICE PROGRESS NOTE   Diagnosis: Colon cancer  INTERVAL HISTORY:   Christy Adams returns for a scheduled visit.  She underwent resection of the sigmoid colon tumor at [facility] on 12/30/2021.  She was discharged to home 01/01/2022.  She continues to have abdominal soreness.  She is eating and having bowel movements.  She is scheduled for follow-up with surgery tomorrow.  She is taking Lovenox prophylaxis.  Objective:  Vital signs in last 24 hours:  Blood pressure 119/79, pulse 65, temperature 98.2 F (36.8 C), resp. rate 18, height _0  (1.676 m), weight 212 lb 3.2 oz (96.3 kg), SpO2 98 %.    Resp: Lungs clear bilaterally Cardio: Regular rate and rhythm GI: Soft, the surgical incisions appear healed with glue in place. Vascular: No leg edema  Lab Results:  Lab Results  Component Value Date   WBC 7.2 03/14/2017   HGB 13.1 03/14/2017   HCT 40.9 03/14/2017   MCV 79.0 (L) 03/14/2017   PLT 280 03/14/2017   NEUTROABS 4,320 03/14/2017    CMP  Lab Results  Component Value Date   NA 136 03/14/2017   K 4.4 03/14/2017   CL 104 03/14/2017   CO2 25 03/14/2017   GLUCOSE 94 03/14/2017   BUN 18 03/14/2017   CREATININE 0.83 03/14/2017   CALCIUM 9.4 03/14/2017   PROT 6.9 03/14/2017   AST 21 03/14/2017   ALT 28 03/14/2017   BILITOT 0.3 03/14/2017     Medications: I have reviewed the patient's current medications.   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  She has been diagnosed with stage I colon cancer.  We reviewed details of the surgery pathology report and discussed the prognosis.  I do not recommend adjuvant systemic therapy.  We discussed diet and exercise maneuvers which may decrease the risk of developing colorectal cancer.  She understands the recommendation to continue colonoscopy surveillance beginning with a colonoscopy in approximately 1 year.  She would like to undergo surveillance imaging.  She will be scheduled for CTs of the chest, abdomen, and pelvis prior to an office visit in November.  We will request MSI testing on the resected tumor.  She will continue surgical follow-up with Dr. Maxie Better.  We refer her to the genetics counselor.  She will return for a scheduled visit in November.  We are available to see her sooner as needed.  Betsy Coder, MD  01/10/2022  10:17 AM

## 2022-01-24 ENCOUNTER — Encounter: Payer: Self-pay | Admitting: *Deleted

## 2022-01-25 ENCOUNTER — Inpatient Hospital Stay: Payer: 59 | Attending: Oncology | Admitting: Nutrition

## 2022-01-25 NOTE — Progress Notes (Signed)
51 year old female diagnosed with Colon Cancer s/p low anterior resection. No adjuvant treatment required. She is followed by Dr. Benay Spice.  PMH includes depression and anxiety.  Medications include Wellbutrin and Zoloft.  Labs reviewed.  Height: 66 inches Weight: 212 pounds UBW: ~210 pounds. BMI: 34.25.  Subject interested in healthy diet to minimize recurrence. "Face timed" with patient. Questions included sugar and cancer, red and processed meats, and alcohol usage.  Nutrition Diagnosis: Food and Nutrition Related Knowledge Deficit related to colon cancer as evidenced by no prior need for nutrition related information  Intervention: Educated to consume plant based diet with 2/3 plate filled with plant based foods and 1/3 plate with lean protein. Limit red and processed meats. Increase physical activity. Work towards maintaining a healthy weight. Include fermented foods and probiotics for gut health. Education provided on sugar and cancer. Mail nutrition fact sheets. Contact information provided. All questions answered.  Monitoring, Evaluation, Goals: Patient will tolerate healthy plant based diet and increased physical activity to minimize chance for recurrence.  No follow up visit scheduled.

## 2022-02-23 ENCOUNTER — Encounter: Payer: Self-pay | Admitting: Genetic Counselor

## 2022-02-23 ENCOUNTER — Inpatient Hospital Stay: Payer: 59

## 2022-02-23 ENCOUNTER — Other Ambulatory Visit: Payer: Self-pay | Admitting: Genetic Counselor

## 2022-02-23 ENCOUNTER — Inpatient Hospital Stay (HOSPITAL_BASED_OUTPATIENT_CLINIC_OR_DEPARTMENT_OTHER): Payer: 59 | Admitting: Genetic Counselor

## 2022-02-23 DIAGNOSIS — Z1379 Encounter for other screening for genetic and chromosomal anomalies: Secondary | ICD-10-CM

## 2022-02-23 DIAGNOSIS — C187 Malignant neoplasm of sigmoid colon: Secondary | ICD-10-CM

## 2022-02-23 DIAGNOSIS — Z803 Family history of malignant neoplasm of breast: Secondary | ICD-10-CM

## 2022-02-23 LAB — GENETIC SCREENING ORDER

## 2022-02-23 NOTE — Progress Notes (Signed)
REFERRING PROVIDER: Ladell Pier, MD [address] [place],  [place] [address]  PRIMARY PROVIDER:  [place] [place] [facility]  Encounter Diagnoses  Name Primary?   Malignant neoplasm of sigmoid colon ([place]) Yes   Family history of breast cancer in mother    HISTORY OF PRESENT ILLNESS:   Ms. Helwig, a 51 y.o. female, was seen for a [place] cancer genetics consultation at the request of Dr. Benay Spice due to a personal history of cancer.  Ms. Joffe presents to clinic today to discuss the possibility of a hereditary predisposition to cancer, to discuss genetic testing, and to further clarify her future cancer risks, as well as potential cancer risks for family members.   In June 2023, at the age of 49, Ms. Balandran was diagnosed with colon cancer. The tumor showed normal MMR and IHC.   CANCER HISTORY:  Oncology History  Colon cancer ([place])  11/30/2021 Initial Diagnosis   Colon cancer ([place])   11/30/2021 Cancer Staging   Staging form: Colon and Rectum, AJCC 8th Edition - Clinical: Stage Unknown (cTX, cN0, cM0) - Signed by Ladell Pier, MD on 11/30/2021 Total positive nodes: 0      RISK FACTORS:  Menarche was at age 67.  First live birth at age 29.  OCP use for approximately  15  years.  Ovaries intact: yes.  Uterus intact: yes.  Menopausal status: premenopausal HRT use: 0 years. Mammogram within the last year: yes. Number of breast biopsies: 0. Up to date with pelvic exams: yes. Any excessive radiation exposure in the past: no  Past Medical History:  Diagnosis Date   Abnormal Pap smear of cervix    Allergy    Anemia    Anxiety    Asthma    allergic to dust - only uses inhaler prn - rarely   Depression    Headache    otc med prn - rare   HPV in female    Panic attacks    PONV (postoperative nausea and vomiting)    Sleep apnea    no CPAP   SVD (spontaneous vaginal delivery)    x 3    Past Surgical History:  Procedure  Laterality Date   ABLATION  09/10/2009   COLPOSCOPY  05/21/2017   By [facility]    DILATION AND CURETTAGE OF UTERUS  09/10/2009   hysteroscopy with Novasure   excision of vaginal mole  01/10/2005   EYE SURGERY     strabismus, lens surgery   KNEE SURGERY     left   LAPAROSCOPIC TUBAL LIGATION Bilateral 09/14/2014   Procedure: LAPAROSCOPIC TUBAL LIGATION;  Surgeon: Paula Compton, MD;  Location: [facility];  Service: Gynecology;  Laterality: Bilateral;   PERCUTANEOUS PINNING Left 01/27/2021   Procedure: Closed reduction and pinning of the left hallux proximal phalanx;  Surgeon: Wylene Simmer, MD;  Location: [facility];  Service: Orthopedics;  Laterality: Left;   TONSILLECTOMY     TUBAL LIGATION     wisedom teeth      Social History   Socioeconomic History   Marital status: Divorced    Spouse name: Not on file   Number of children: Not on file   Years of education: Not on file   Highest education level: Not on file  Occupational History   Not on file  Tobacco Use   Smoking status: Never   Smokeless tobacco: Never  Vaping Use   Vaping Use: Never used  Substance and Sexual Activity   Alcohol use: Yes    Comment: wine at night 2-3 days per week.    Drug use: No   Sexual activity: Not Currently    Partners: Male    Comment: ablation and BTL  Other Topics Concern   Not on file  Social History Narrative   Not on file   Social Determinants of Health   Financial Resource Strain: Not on file  Food Insecurity: Not on file  Transportation Needs: Not on file  Physical Activity: Not on file  Stress: Not on file  Social Connections: Not on file     FAMILY HISTORY:  We obtained a detailed, 4-generation family history.  Significant diagnoses are listed below: Family History  Problem Relation Age of Onset   Heart disease Mother    Depression Mother    Hypertension Mother    Thyroid disease Mother    Breast cancer Mother 93   Colon polyps Mother    Heart  attack Father 11   Psoriasis Sister    Skin cancer Maternal Aunt    Heart failure Maternal Grandmother    Diabetes Maternal Grandfather    Heart attack Maternal Grandfather      Ms. Atilano's mother was diagnosed with breast cancer at age 48, her treatment included a lumpectomy, radiation, and tamoxifen, she died at age 20. Her maternal aunt was diagnosed with skin cancer at age 16. Ms. Spaid is unaware of previous family history of genetic testing for hereditary cancer risks. There is no reported Ashkenazi Jewish ancestry.   GENETIC COUNSELING ASSESSMENT: Ms. Aron is a 51 y.o. female with a personal and family history of cancer which is somewhat suggestive of a hereditary predisposition to cancer given her young age at diagnosis. We, therefore, discussed and recommended the following at today's visit.   DISCUSSION: We discussed that 5 - 10% of cancer is hereditary, with most cases of colon cancer associated with Lynch Syndrome.  There are other genes that can be associated with hereditary colon cancer syndromes.  We discussed that testing is beneficial for several reasons including knowing how to follow individuals after completing their treatment and understanding if other family members could be at an increased risk for cancer.   We reviewed the characteristics, features and inheritance patterns of hereditary cancer syndromes. We also discussed genetic testing, including the appropriate family members to test, the process of testing, insurance coverage and turn-around-time for results. We discussed the implications of a negative, positive, carrier and/or variant of uncertain significant result. We recommended Ms. Chesnut pursue genetic testing for a panel that includes genes associated with colon and breast cancer.   Ms. Lown  was offered a common hereditary cancer panel (47 genes) and an expanded pan-cancer panel (77 genes). Ms. Notte was informed of the benefits and limitations of each panel,  including that expanded pan-cancer panels contain genes that do not have clear management guidelines at this point in time.  We also discussed that as the number of genes included on a panel increases, the chances of variants of uncertain significance increases. After considering the benefits and limitations of each gene panel, Ms. Solivan elected to have [facility] CancerNext-Expanded Panel.  The CancerNext-Expanded gene panel offered by [facility] and includes sequencing, rearrangement, and RNA analysis for the following 77 genes: AIP, ALK, APC, ATM, AXIN2, BAP1, BARD1, BLM, BMPR1A, BRCA1, BRCA2, BRIP1, CDC73, CDH1, CDK4, CDKN1B, CDKN2A, CHEK2, CTNNA1, DICER1, FANCC, FH, FLCN, GALNT12, KIF1B, LZTR1, MAX, MEN1, MET, MLH1, MSH2,  MSH3, MSH6, MUTYH, NBN, NF1, NF2, NTHL1, PALB2, PHOX2B, PMS2, POT1, PRKAR1A, PTCH1, PTEN, RAD51C, RAD51D, RB1, RECQL, RET, SDHA, SDHAF2, SDHB, SDHC, SDHD, SMAD4, SMARCA4, SMARCB1, SMARCE1, STK11, SUFU, TMEM127, TP53, TSC1, TSC2, VHL and XRCC2 (sequencing and deletion/duplication); EGFR, EGLN1, HOXB13, KIT, MITF, PDGFRA, POLD1, and POLE (sequencing only); EPCAM and GREM1 (deletion/duplication only).   Based on Ms. Mcclarty's personal and family history of cancer, she meets medical criteria for genetic testing. Despite that she meets criteria, she may still have an out of pocket cost. We discussed that if her out of pocket cost for testing is over $100, the laboratory will call and confirm whether she wants to proceed with testing.  If the out of pocket cost of testing is less than $100 she will be billed by the genetic testing laboratory.   PLAN: After considering the risks, benefits, and limitations, Ms. Novello provided informed consent to pursue genetic testing and the blood sample was sent to [facility] for analysis of the CancerNext-Expanded Panel. Results should be available within approximately 2-3 weeks' time, at which point they will be disclosed by telephone to Ms. Ramroop, as  will any additional recommendations warranted by these results. Ms. Mcgeehan will receive a summary of her genetic counseling visit and a copy of her results once available. This information will also be available in Epic.   Ms. Eversley's questions were answered to her satisfaction today. Our contact information was provided should additional questions or concerns arise. Thank you for the referral and allowing [place] to share in the care of your patient.   Lucille Passy, MS, [facility] Genetic Counselor [email or web address] (P) [phone]  The patient was seen for a total of 40 minutes in face-to-face genetic counseling. The patient was seen alone.  Drs. Lindi Adie and/or Burr Medico were available to discuss this case as needed.   _______________________________________________________________________ For Office Staff:  Number of people involved in session: 1 Was an Intern/ student involved with case: no

## 2022-03-08 ENCOUNTER — Encounter: Payer: Self-pay | Admitting: Genetic Counselor

## 2022-03-08 DIAGNOSIS — Z1379 Encounter for other screening for genetic and chromosomal anomalies: Secondary | ICD-10-CM | POA: Insufficient documentation

## 2022-03-10 ENCOUNTER — Telehealth: Payer: Self-pay | Admitting: Genetic Counselor

## 2022-03-10 NOTE — Telephone Encounter (Signed)
I attempted to contact Ms. Berish to discuss her genetic testing results (77 genes). Her voicemail box is full.  Lucille Passy, MS, [facility] Genetic Counselor [email or web address] (P) [phone]

## 2022-03-17 ENCOUNTER — Telehealth: Payer: Self-pay | Admitting: Genetic Counselor

## 2022-03-17 NOTE — Telephone Encounter (Signed)
I contacted Christy Adams to discuss her genetic testing results. No pathogenic variants were identified in the 77 genes analyzed. Of note, a variant of uncertain significance was identified in the BRCA2 gene. Detailed clinic note to follow.  The test report has been scanned into EPIC and is located under the Molecular Pathology section of the Results Review tab.  A portion of the result report is included below for reference.   Lucille Passy, MS, [facility] Genetic Counselor [email or web address] (P) [phone]

## 2022-03-30 ENCOUNTER — Ambulatory Visit: Payer: Self-pay | Admitting: Genetic Counselor

## 2022-03-30 ENCOUNTER — Encounter: Payer: Self-pay | Admitting: Genetic Counselor

## 2022-03-30 DIAGNOSIS — Z1379 Encounter for other screening for genetic and chromosomal anomalies: Secondary | ICD-10-CM

## 2022-03-30 NOTE — Progress Notes (Signed)
HPI:   Christy Adams. Pask was previously seen in the [place] clinic due to a personal and family history of cancer and concerns regarding a hereditary predisposition to cancer. Please refer to our prior cancer genetics clinic note for more information regarding our discussion, assessment and recommendations, at the time. Christy Adams. Barco's recent genetic test results were disclosed to her, as were recommendations warranted by these results. These results and recommendations are discussed in more detail below.  CANCER HISTORY:  Oncology History  Colon cancer ([place])  11/30/2021 Initial Diagnosis   Colon cancer ([place])   11/30/2021 Cancer Staging   Staging form: Colon and Rectum, AJCC 8th Edition - Clinical: Stage Unknown (cTX, cN0, cM0) - Signed by Ladell Pier, MD on 11/30/2021 Total positive nodes: 0    Genetic Testing   [facility] CancerNext-Expanded Panel was Negative. 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 Report date is 03/08/2022.  The CancerNext-Expanded gene panel offered by [facility] and includes sequencing, rearrangement, and RNA analysis for the following 77 genes: AIP, ALK, APC, ATM, AXIN2, BAP1, BARD1, BLM, BMPR1A, BRCA1, BRCA2, BRIP1, CDC73, CDH1, CDK4, CDKN1B, CDKN2A, CHEK2, CTNNA1, DICER1, FANCC, FH, FLCN, GALNT12, KIF1B, LZTR1, MAX, MEN1, MET, MLH1, MSH2, MSH3, MSH6, MUTYH, NBN, NF1, NF2, NTHL1, PALB2, PHOX2B, PMS2, POT1, PRKAR1A, PTCH1, PTEN, RAD51C, RAD51D, RB1, RECQL, RET, SDHA, SDHAF2, SDHB, SDHC, SDHD, SMAD4, SMARCA4, SMARCB1, SMARCE1, STK11, SUFU, TMEM127, TP53, TSC1, TSC2, VHL and XRCC2 (sequencing and deletion/duplication); EGFR, EGLN1, HOXB13, KIT, MITF, PDGFRA, POLD1, and POLE (sequencing only); EPCAM and GREM1 (deletion/duplication only).      FAMILY HISTORY:  We obtained a detailed, 4-generation family history.  Significant diagnoses are listed below:      Family History  Problem Relation Age of Onset   Heart  disease Mother     Depression Mother     Hypertension Mother     Thyroid disease Mother     Breast cancer Mother 65   Colon polyps Mother     Heart attack Father 18   Psoriasis Sister     Skin cancer Maternal Aunt     Heart failure Maternal Grandmother     Diabetes Maternal Grandfather     Heart attack Maternal Grandfather         Christy Adams. Jhaveri's mother was diagnosed with breast cancer at age 84, her treatment included a lumpectomy, radiation, and tamoxifen, she died at age 61. Her maternal aunt was diagnosed with skin cancer at age 68. Christy Adams. Kuyper is unaware of previous family history of genetic testing for hereditary cancer risks. There is no reported Ashkenazi Jewish ancestry.    GENETIC COUNSELING ASSESSMENT: Christy Adams. Abelson is a 51 y.o. female with a personal and family history of cancer which is somewhat suggestive of a hereditary predisposition to cancer given her young age at diagnosis. We, therefore, discussed and recommended the following at today's visit.   GENETIC TEST RESULTS:  The [facility] CancerNext-Expanded Panel found no pathogenic mutations.  The CancerNext-Expanded gene panel offered by [facility] and includes sequencing, rearrangement, and RNA analysis for the following 77 genes: AIP, ALK, APC, ATM, AXIN2, BAP1, BARD1, BLM, BMPR1A, BRCA1, BRCA2, BRIP1, CDC73, CDH1, CDK4, CDKN1B, CDKN2A, CHEK2, CTNNA1, DICER1, FANCC, FH, FLCN, GALNT12, KIF1B, LZTR1, MAX, MEN1, MET, MLH1, MSH2, MSH3, MSH6, MUTYH, NBN, NF1, NF2, NTHL1, PALB2, PHOX2B, PMS2, POT1, PRKAR1A, PTCH1, PTEN, RAD51C, RAD51D, RB1, RECQL, RET, SDHA, SDHAF2, SDHB, SDHC, SDHD, SMAD4, SMARCA4, SMARCB1, SMARCE1, STK11, SUFU, TMEM127, TP53, TSC1, TSC2, VHL  and XRCC2 (sequencing and deletion/duplication); EGFR, EGLN1, HOXB13, KIT, MITF, PDGFRA, POLD1, and POLE (sequencing only); EPCAM and GREM1 (deletion/duplication only).    The test report has been scanned into EPIC and is located under the Molecular Pathology section of the Results  Review tab.  A portion of the result report is included below for reference. Genetic testing reported out on 03/08/2022.       Genetic testing identified a variant of uncertain significance (VUS) in the BRCA2 gene called  p.T207P.  At this time, it is unknown if this variant is associated with an increased risk for cancer or if it is benign, but most uncertain variants are reclassified to benign. It should not be used to make medical management decisions. With time, we suspect the laboratory will determine the significance of this variant, if any. If the laboratory reclassifies this variant, we will attempt to contact Christy Adams. Snedden to discuss it further.   Even though a pathogenic variant was not identified, possible explanations for her personal history of cancer may include: There may be no hereditary risk for cancer in the family. The cancers in Christy Adams. Barg and/or her family may be due to other genetic or environmental factors. There may be a gene mutation in one of these genes that current testing methods cannot detect, but that chance is small. There could be another gene that has not yet been discovered, or that we have not yet tested, that is responsible for the cancer diagnoses in the family.   Therefore, it is important to remain in touch with cancer genetics in the future so that we can continue to offer Christy Adams. Thurgood the most up to date genetic testing.   ADDITIONAL GENETIC TESTING:  We discussed with Christy Adams. Brazell that her genetic testing was fairly extensive.  If there are genes identified to increase cancer risk that can be analyzed in the future, we would be happy to discuss and coordinate this testing at that time.    CANCER SCREENING RECOMMENDATIONS:  Christy Adams. Coykendall's test result is considered negative (normal).  This means that we have not identified a hereditary cause for her personal and family history of cancer at this time.   An individual's cancer risk and medical management are not  determined by genetic test results alone. Overall cancer risk assessment incorporates additional factors, including personal medical history, family history, and any available genetic information that may result in a personalized plan for cancer prevention and surveillance. Therefore, it is recommended she continue to follow the cancer management and screening guidelines provided by her oncology and primary healthcare provider.  RECOMMENDATIONS FOR FAMILY MEMBERS:   Since she did not inherit a mutation in a cancer predisposition gene included on this panel, her children could not have inherited a mutation from her in one of these genes. Her children are recommended to begin annual colonoscopies at age 69 (72 years prior to her age at diagnosis). We do not recommend familial testing for the BRCA2 variant of uncertain significance (VUS).  FOLLOW-UP:  Cancer genetics is a rapidly advancing field and it is possible that new genetic tests will be appropriate for her and/or her family members in the future. We encouraged her to remain in contact with cancer genetics on an annual basis so we can update her personal and family histories and let her know of advances in cancer genetics that may benefit this family.   Our contact number was provided. Christy Adams. Bulluck's questions were answered to her satisfaction, and she knows  she is welcome to call us at anytime with additional questions or concerns.   Christy Passy, Christy Adams, [email or web address] (P) [phone]

## 2022-04-06 ENCOUNTER — Encounter: Payer: Self-pay | Admitting: Oncology

## 2022-04-07 ENCOUNTER — Ambulatory Visit (HOSPITAL_BASED_OUTPATIENT_CLINIC_OR_DEPARTMENT_OTHER)
Admission: RE | Admit: 2022-04-07 | Discharge: 2022-04-07 | Disposition: A | Discharge status: Home / Self Care | Payer: 59 | Source: Ambulatory Visit | Attending: Oncology | Admitting: Oncology

## 2022-04-07 DIAGNOSIS — C187 Malignant neoplasm of sigmoid colon: Secondary | ICD-10-CM | POA: Diagnosis not present

## 2022-04-07 LAB — POCT I-STAT CREATININE: Creatinine, Ser: 1.1 mg/dL — ABNORMAL HIGH (ref 0.44–1.00)

## 2022-04-07 MED ORDER — IOHEXOL 300 MG/ML  SOLN
100.0000 mL | Freq: Once | INTRAMUSCULAR | Status: AC | PRN
  Administered 2022-04-07: 100 mL via INTRAVENOUS

## 2022-04-18 ENCOUNTER — Telehealth (INDEPENDENT_AMBULATORY_CARE_PROVIDER_SITE_OTHER): Payer: 59 | Admitting: Psychiatry

## 2022-04-18 ENCOUNTER — Encounter: Payer: Self-pay | Admitting: Psychiatry

## 2022-04-18 DIAGNOSIS — F4310 Post-traumatic stress disorder, unspecified: Secondary | ICD-10-CM

## 2022-04-18 DIAGNOSIS — F3342 Major depressive disorder, recurrent, in full remission: Secondary | ICD-10-CM

## 2022-04-18 DIAGNOSIS — F4001 Agoraphobia with panic disorder: Secondary | ICD-10-CM | POA: Diagnosis not present

## 2022-04-18 MED ORDER — BUPROPION HCL ER (XL) 300 MG PO TB24: 300.0000 mg | ORAL_TABLET | Freq: Every day | ORAL | 1 refills | Status: DC

## 2022-04-18 MED ORDER — SERTRALINE HCL 100 MG PO TABS: 150.0000 mg | ORAL_TABLET | Freq: Every day | ORAL | 1 refills | Status: DC

## 2022-04-18 MED ORDER — BUSPIRONE HCL 30 MG PO TABS: 30.0000 mg | ORAL_TABLET | Freq: Every day | ORAL | 1 refills | Status: DC

## 2022-04-18 NOTE — Progress Notes (Signed)
Christy Adams [id number] [birth year] 51 y.o.   Video Visit via My Chart  I connected with pt by My Chart and verified that I am speaking with the correct person using two identifiers.   I discussed the limitations, risks, security and privacy concerns of performing an evaluation and management service by My Chart  and the availability of in person appointments. I also discussed with the patient that there may be a patient responsible charge related to this service. The patient expressed understanding and agreed to proceed.  I discussed the assessment and treatment plan with the patient. The patient was provided an opportunity to ask questions and all were answered. The patient agreed with the plan and demonstrated an understanding of the instructions.   The patient was advised to call back or seek an in-person evaluation if the symptoms worsen or if the condition fails to improve as anticipated.  I provided 30 minutes of video time during this encounter.  The patient was located at home and the provider was located office. Session from 1130-1200 pm  Subjective:   Patient ID:  Christy Adams is a 51 y.o. (DOB [birth year]) female.  Chief Complaint:  Chief Complaint  Patient presents with   Follow-up   Depression   Post-Traumatic Stress Disorder   Anxiety    HPI Christy Adams presents to the office today for follow-up of anxiety and depression.  visit August 20, 2018 & 02/2019.  No meds were changed.  2020/08/26 appt noted:  Mother died August 10, 2019 from massive stroke.  Hemorrhagic. Can't cry very easily.  Wonders if sertraline interferes.  Wonders if it interferes with grief and she'll be having more problems later.  Doesn't feel like she's holding emotions back.   Has not felt flat before and has felt good emotionally.  Anxiety managed.   Good job supportive. Son to college in fall. Sisters and mother also on sertraline and Wellbutrin. pLAN: OK trial reduction in sertraline to  150 mg to see if grief tears are easier to come and history of stability in past on 150 mg daily. Continue buspirone 30 mg twice daily for PTSD and augmentation for depression Continue Wellbutrin XL 300 mg a.m. for depression and a history of ADD symptoms  10/20/20 TC:  wanted med change.  Increased sertraline back to 200 mg daily.  07/12/2021 appt noted: Has been trying to stick with sertraline 150 mg daily.  Occ misses a day. Has been runnin gout of buspar and didn't feel good. On way hand function well.  Hard year bc losing mother.   Still don't cry easily on sertraline and doesn't like it. Restarting therapy. Overall feels ok with meds. No sig anxiety but a lot of emotional things and I feel detached. Not hlpeless.  Bluntness can be surprising. Sister health issues.  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 Plan: BC blunted with sertraline:  Reduce sertraline to 1 tablet daily for 5 days, then start fluoxetine 1 daily and reduce sertraline to 1/2 tablet for 1 week, then stop sertraline and continue 1 fluoxetine daily Continue buspirone 30 mg twice daily for PTSD and augmentation for depression Continue Wellbutrin XL 300 mg a.m. for depression and a history of ADD symptoms  09/07/2021 phone call complaining that anxiety is worse on fluoxetine and she will go back to sertraline. MD response:We switched from sertraline 2 months ago.  Her anxiety is worse on the fluoxetine.  This is a known risk and I agree with her that switching back  to sertraline makes the most sense.  However reduce fluoxetine to 20 mg daily or 40 mg every other day and start sertraline 1/2 tablet of the 100 mg tablet size for 5 days, then continue fluoxetine 20 mg daily and increase sertraline to 1 daily for 5 days, then stop fluoxetine and increase sertraline to 1-1/2 daily.  Please send in a prescription for sertraline 100 mg tablets 1-1/2 daily #45 and 1 refill  10/11/21 appt noted: Switched back to sertraline and 90% better.   More willing to put up with the flat SE from sertraline.  Anxiety unmanageable with fluoxetine.  On sertraline 150 mg daily.  Astonishing how much better I am.  Felt terrible on fluoxetine. Patient reports stable mood and denies depressed or irritable moods.  Patient denies any recent difficulty with anxiety.  Patient denies difficulty with sleep initiation or maintenance. Denies appetite disturbance.  Patient reports that energy and motivation have been good.  Patient denies any difficulty with concentration.  Patient denies any suicidal ideation. Plan: Worse with switch to fluoxetine Continue sertraline 150 mg daily Continue buspirone 30 mg twice daily for PTSD and augmentation for depression Continue Wellbutrin XL 300 mg a.m. for depression and a history of ADD symptoms  04/18/22 appt noted: Things are good.  More like herself with a range of emotions.  Ablet to weep. Had colon cancer on first screening colonoscopy in May.  Stage 1 resection. And no further tx needed.  At [facility] and in study.  Did CBT for pain management and it was great.   It also helped her anxiety and included PMR and other techniques.  Gave her more control. No more pain and cancer free. In [place] support group. Moving parents. No SE Patient reports stable mood and denies depressed or irritable moods.  Patient denies any recent difficulty with anxiety.  Patient denies difficulty with sleep initiation or maintenance. Denies appetite disturbance.  Patient reports that energy and motivation have been good.  Patient denies any difficulty with concentration.  Patient denies any suicidal ideation.  Past Psychiatric Medication Trials: Sertraline 200 (on Zoloft 20 years) fluoxetine no response in early 20's, retry 2023 NR after 6 weeks and anxiety unmanageable   Wellbutrin XL 300,  buspirone 30 twice daily,  clonazepam, lorazepam,  Stimulants anxiety  Piper D seeing Shirley Muscat  Review of Systems:  Review of Systems   Respiratory:  Negative for cough.   Cardiovascular:  Negative for chest pain and palpitations.  Neurological:  Negative for tremors.  Psychiatric/Behavioral:  Negative for agitation, behavioral problems, confusion, decreased concentration, dysphoric mood, hallucinations, self-injury, sleep disturbance and suicidal ideas. The patient is not nervous/anxious and is not hyperactive.     Medications: I have reviewed the patient's current medications.  Current Outpatient Medications  Medication Sig Dispense Refill   montelukast (SINGULAIR) 10 MG tablet TK 1 T PO QHS     buPROPion (WELLBUTRIN XL) 300 MG 24 hr tablet Take 1 tablet (300 mg total) by mouth daily. 90 tablet 1   busPIRone (BUSPAR) 30 MG tablet Take 1 tablet (30 mg total) by mouth at bedtime. 180 tablet 1   sertraline (ZOLOFT) 100 MG tablet Take 1.5 tablets (150 mg total) by mouth daily. 135 tablet 1   No current facility-administered medications for this visit.    Medication Side Effects: None  Allergies: No Known Allergies  Past Medical History:  Diagnosis Date   Abnormal Pap smear of cervix    Allergy    Anemia  Anxiety    Asthma    allergic to dust - only uses inhaler prn - rarely   Depression    Headache    otc med prn - rare   HPV in female    Panic attacks    PONV (postoperative nausea and vomiting)    Sleep apnea    no CPAP   SVD (spontaneous vaginal delivery)    x 3    Family History  Problem Relation Age of Onset   Heart disease Mother    Depression Mother    Hypertension Mother    Thyroid disease Mother    Breast cancer Mother 79   Colon polyps Mother    Heart attack Father 48   Psoriasis Sister    Skin cancer Maternal Aunt    Heart failure Maternal Grandmother    Diabetes Maternal Grandfather    Heart attack Maternal Grandfather     Social History   Socioeconomic History   Marital status: Divorced    Spouse name: Not on file   Number of children: Not on file   Years of education: Not  on file   Highest education level: Not on file  Occupational History   Not on file  Tobacco Use   Smoking status: Never   Smokeless tobacco: Never  Vaping Use   Vaping Use: Never used  Substance and Sexual Activity   Alcohol use: Yes    Comment: wine at night 2-3 days per week.    Drug use: No   Sexual activity: Not Currently    Partners: Male    Comment: ablation and BTL  Other Topics Concern   Not on file  Social History Narrative   Not on file   Social Determinants of Health   Financial Resource Strain: Not on file  Food Insecurity: Not on file  Transportation Needs: Not on file  Physical Activity: Not on file  Stress: Not on file  Social Connections: Not on file  Intimate Partner Violence: Not on file    Past Medical History, Surgical history, Social history, and Family history were reviewed and updated as appropriate.   Please see review of systems for further details on the patient's review from today.   Objective:   Physical Exam:  There were no vitals taken for this visit.  Physical Exam Constitutional:      General: She is not in acute distress. Musculoskeletal:        General: No deformity.  Neurological:     Mental Status: She is alert and oriented to person, place, and time.     Cranial Nerves: No dysarthria.     Coordination: Coordination normal.  Psychiatric:        Attention and Perception: Attention and perception normal. She does not perceive auditory or visual hallucinations.        Mood and Affect: Mood normal. Mood is not anxious or depressed. Affect is not labile, blunt or inappropriate.        Speech: Speech normal.        Behavior: Behavior normal. Behavior is cooperative.        Thought Content: Thought content normal. Thought content is not delusional. Thought content does not include homicidal or suicidal ideation. Thought content does not include suicidal plan.        Cognition and Memory: Cognition and memory normal.         Judgment: Judgment normal.     Comments: Anxiety back under control on sertraline  Lab Review:     Component Value Date/Time   NA 136 03/14/2017 1042   K 4.4 03/14/2017 1042   CL 104 03/14/2017 1042   CO2 25 03/14/2017 1042   GLUCOSE 94 03/14/2017 1042   BUN 18 03/14/2017 1042   CREATININE 1.10 (H) 04/07/2022 0729   CREATININE 0.83 03/14/2017 1042   CALCIUM 9.4 03/14/2017 1042   PROT 6.9 03/14/2017 1042   AST 21 03/14/2017 1042   ALT 28 03/14/2017 1042   BILITOT 0.3 03/14/2017 1042       Component Value Date/Time   WBC 7.2 03/14/2017 1042   RBC 5.18 (H) 03/14/2017 1042   HGB 13.1 03/14/2017 1042   HCT 40.9 03/14/2017 1042   PLT 280 03/14/2017 1042   MCV 79.0 (L) 03/14/2017 1042   MCH 25.3 (L) 03/14/2017 1042   MCHC 32.0 03/14/2017 1042   RDW 13.3 03/14/2017 1042   LYMPHSABS 1,865 03/14/2017 1042   EOSABS 367 03/14/2017 1042   BASOSABS 86 03/14/2017 1042    No results found for: "POCLITH", "LITHIUM"   No results found for: "PHENYTOIN", "PHENOBARB", "VALPROATE", "CBMZ"   .res Asse2ssment: Plan:    Recurrent major depression in full remission ([place]) - Plan: buPROPion (WELLBUTRIN XL) 300 MG 24 hr tablet  PTSD (post-traumatic stress disorder) - Plan: busPIRone (BUSPAR) 30 MG tablet, sertraline (ZOLOFT) 100 MG tablet  Panic disorder with agoraphobia - Plan: sertraline (ZOLOFT) 100 MG tablet  History of ADD  Good response to meds.  We discussed at length how sertraline can sometimes have somewhat of a numbing effect.  However she has not typically experience that effect.  Discussed that this is dose related.  She reports each she has been stable on a lower dose of sertraline 150 mg but still can't cry.  Has not been having recent panic or anxiety or depressive symptoms.  Discussed the risk of relapse with reduction or change in sertraline.    Anxiety and depression are under good control.  Buspirone in particular helps anxiety.  She notices more when she tends to run  out.  Worse with switch to fluoxetine Continue sertraline 150 mg daily Continue buspirone 30 mg twice daily for PTSD and augmentation for depression Continue Wellbutrin XL 300 mg a.m. for depression and a history of ADD symptoms  Disc alternativve of Lexapro or Celexa but less dosing flexibility and greater risk of wt gain usually.  Defer  Disc accidental adverse rxn to delta 8 lasting 27 hours causing severe anxiety and confusion.  We are not using stimulants at this time for ADD because of their tendency to make her anxious.  Follow-up 12 months  Lynder Parents, MD, DFAPA  Please see After Visit Summary for patient specific instructions.  Future Appointments  Date Time Provider [place]  05/08/2022  8:15 AM [facility] PHLEBOTOMIST [facility] None  05/10/2022  8:50 AM Ladell Pier, MD [facility] None    No orders of the defined types were placed in this encounter.     -------------------------------

## 2022-04-19 ENCOUNTER — Telehealth: Payer: Self-pay

## 2022-04-19 NOTE — Telephone Encounter (Signed)
Patient gave verbal understanding had no further questions or concerns. 

## 2022-04-19 NOTE — Telephone Encounter (Signed)
-----   Message from Ladell Pier, MD sent at 04/18/2022  8:50 PM EDT ----- Please call patient, CT are negative for recurrent cancer, f/u as scheduled

## 2022-05-08 ENCOUNTER — Inpatient Hospital Stay: Payer: 59

## 2022-05-08 ENCOUNTER — Inpatient Hospital Stay: Payer: 59 | Attending: Oncology | Admitting: *Deleted

## 2022-05-08 DIAGNOSIS — F32A Depression, unspecified: Secondary | ICD-10-CM | POA: Diagnosis not present

## 2022-05-08 DIAGNOSIS — D123 Benign neoplasm of transverse colon: Secondary | ICD-10-CM | POA: Insufficient documentation

## 2022-05-08 DIAGNOSIS — Z803 Family history of malignant neoplasm of breast: Secondary | ICD-10-CM | POA: Diagnosis not present

## 2022-05-08 DIAGNOSIS — C19 Malignant neoplasm of rectosigmoid junction: Secondary | ICD-10-CM | POA: Diagnosis not present

## 2022-05-08 DIAGNOSIS — C187 Malignant neoplasm of sigmoid colon: Secondary | ICD-10-CM

## 2022-05-08 LAB — BASIC METABOLIC PANEL - CANCER CENTER ONLY
Anion gap: 7 (ref 5–15)
BUN: 17 mg/dL (ref 6–20)
CO2: 28 mmol/L (ref 22–32)
Calcium: 9.3 mg/dL (ref 8.9–10.3)
Chloride: 104 mmol/L (ref 98–111)
Creatinine: 1.12 mg/dL — ABNORMAL HIGH (ref 0.44–1.00)
GFR, Estimated: >60 mL/min (ref >60)
Glucose, Bld: 93 mg/dL (ref 70–99)
Potassium: 4.1 mmol/L (ref 3.5–5.1)
Sodium: 139 mmol/L (ref 135–145)

## 2022-05-08 LAB — CEA (ACCESS): CEA (CHCC): <1 ng/mL (ref 0.00–5.00)

## 2022-05-10 ENCOUNTER — Inpatient Hospital Stay (HOSPITAL_BASED_OUTPATIENT_CLINIC_OR_DEPARTMENT_OTHER): Payer: 59 | Admitting: Oncology

## 2022-05-10 ENCOUNTER — Other Ambulatory Visit (HOSPITAL_BASED_OUTPATIENT_CLINIC_OR_DEPARTMENT_OTHER): Payer: Self-pay

## 2022-05-10 VITALS — BP 118/58 | HR 68 | Temp 98.1°F | Resp 18 | Ht 66.0 in | Wt 213.6 lb

## 2022-05-10 DIAGNOSIS — C19 Malignant neoplasm of rectosigmoid junction: Secondary | ICD-10-CM | POA: Diagnosis not present

## 2022-05-10 DIAGNOSIS — C187 Malignant neoplasm of sigmoid colon: Secondary | ICD-10-CM

## 2022-05-10 MED ORDER — COMIRNATY 30 MCG/0.3ML IM SUSY
PREFILLED_SYRINGE | INTRAMUSCULAR | 0 refills | Status: DC
  Filled 2022-05-10: qty 0.3, 1d supply, fill #0

## 2022-05-10 NOTE — Progress Notes (Signed)
  [facility] OFFICE PROGRESS NOTE   Diagnosis: Colon cancer  INTERVAL HISTORY:   [name] returns as scheduled.  She feels well.  Her bowel movements are more frequent since undergoing the sigmoid surgery.  No bleeding.  Good appetite.  No other complaint.  Objective:  Vital signs in last 24 hours:  Blood pressure (!) 118/58, pulse 68, temperature 98.1 F (36.7 C), temperature source Oral, resp. rate 18, height _0  (1.676 m), weight 213 lb 9.6 oz (96.9 kg), SpO2 99 %.   Lymphatics: No cervical, supraclavicular, axillary, or inguinal nodes Resp: Clear bilaterally Cardio: Regular rate and rhythm GI: No mass, nontender, no hepatosplenomegaly Vascular: No leg edema  Lab Results:  Lab Results  Component Value Date   WBC 7.2 03/14/2017   HGB 13.1 03/14/2017   HCT 40.9 03/14/2017   MCV 79.0 (L) 03/14/2017   PLT 280 03/14/2017   NEUTROABS 4,320 03/14/2017    CMP  Lab Results  Component Value Date   NA 139 05/08/2022   K 4.1 05/08/2022   CL 104 05/08/2022   CO2 28 05/08/2022   GLUCOSE 93 05/08/2022   BUN 17 05/08/2022   CREATININE 1.12 (H) 05/08/2022   CALCIUM 9.3 05/08/2022   PROT 6.9 03/14/2017   AST 21 03/14/2017   ALT 28 03/14/2017   BILITOT 0.3 03/14/2017   GFRNONAA >60 05/08/2022    Lab Results  Component Value Date   CEA <1.00 05/08/2022     Imaging:  CT images from 04/07/2022 reviewed with Ms. Winget  Medications: I have reviewed the patient's current medications.   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  She will return for an office visit and CEA in 6 months.  We will plan for restaging CTs in 6-12 months.  She will be referred to Dr. Collene Mares for a surveillance colonoscopy.  Betsy Coder, MD  05/10/2022  9:09 AM

## 2022-05-11 NOTE — Progress Notes (Signed)
Faxed referral order, demographics, office note,lab and med list to Dr. Dola Argyle [phone].

## 2022-06-02 IMAGING — CT CT CHEST-ABD-PELV W/ CM
2 of 5 series · 14 of 46 positions shown, 16 images · IV contrast (agent unspecified)
Comparison: None Available.

CLINICAL DATA: New diagnosis colon cancer, staging * Tracking Code:
BO *

EXAM:
CT CHEST, ABDOMEN, AND PELVIS WITH CONTRAST
TECHNIQUE: Multidetector CT imaging of the chest, abdomen and pelvis was
performed following the standard protocol during bolus
administration of intravenous contrast.

[Series 2: cap with · axial · 0.85mm/px · z∈[-334,+201]mm · 11 of 129 slices shown, 13 images]
[im 11/129  soft-tissue]
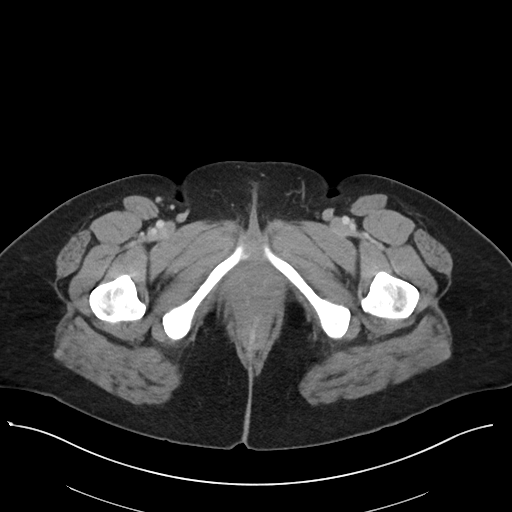
[im 11/129  bone]
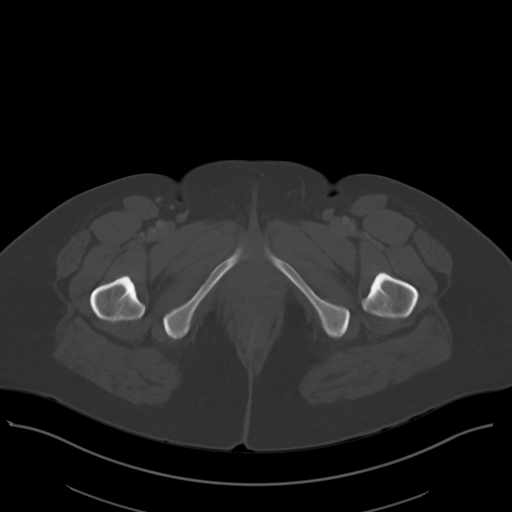
[im 22/129  soft-tissue]
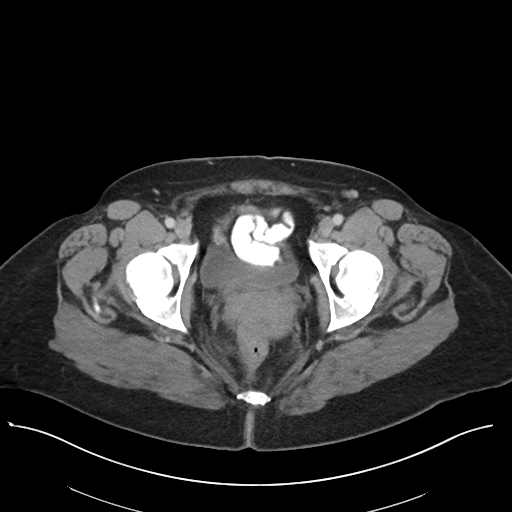
[im 33/129  soft-tissue]
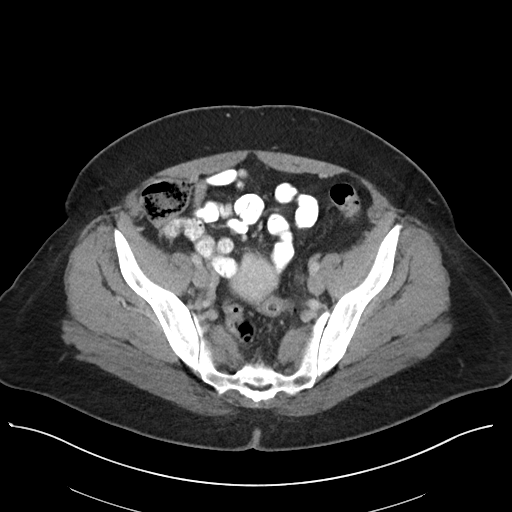
[im 43/129  soft-tissue]
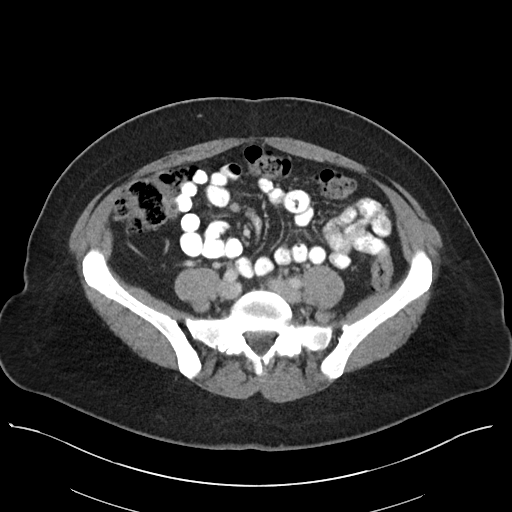
[im 54/129  soft-tissue]
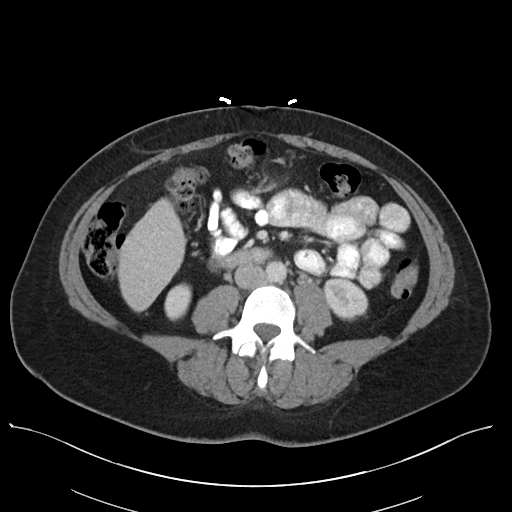
[im 65/129  soft-tissue]
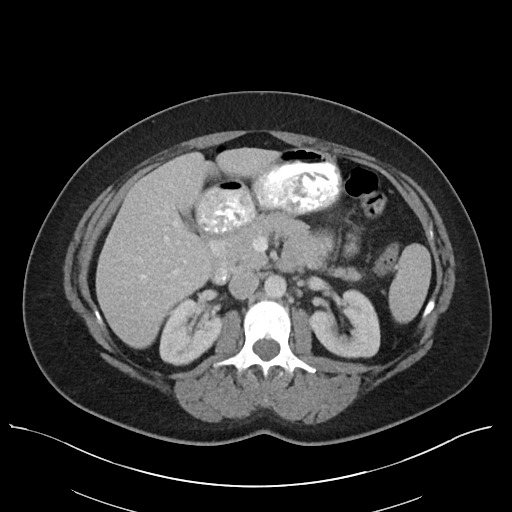
[im 75/129  soft-tissue]
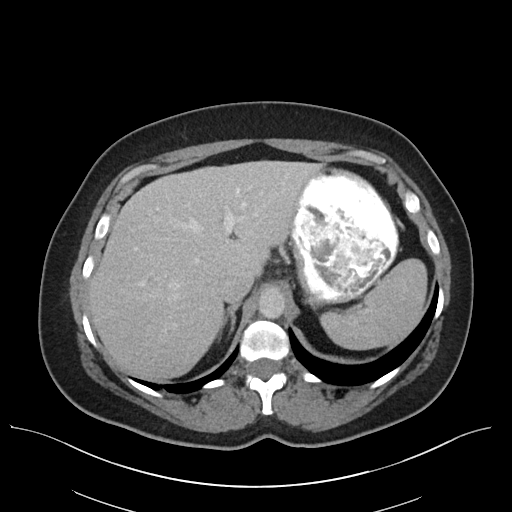
[im 86/129  soft-tissue]
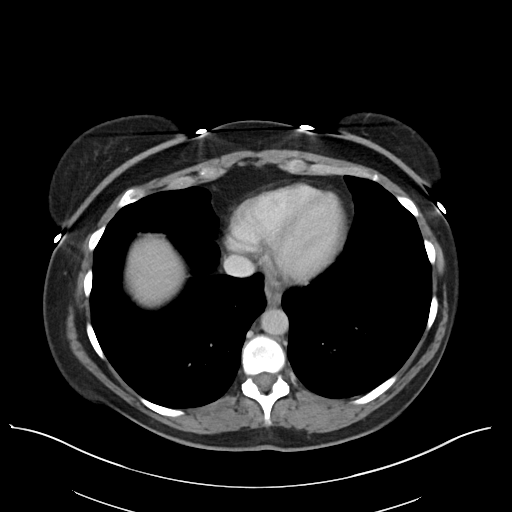
[im 97/129  soft-tissue]
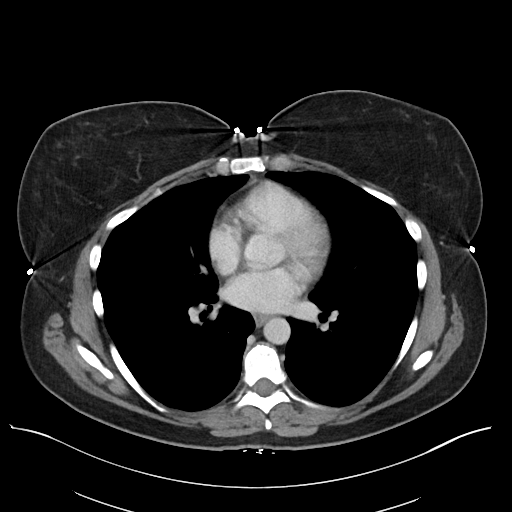
[im 97/129  bone]
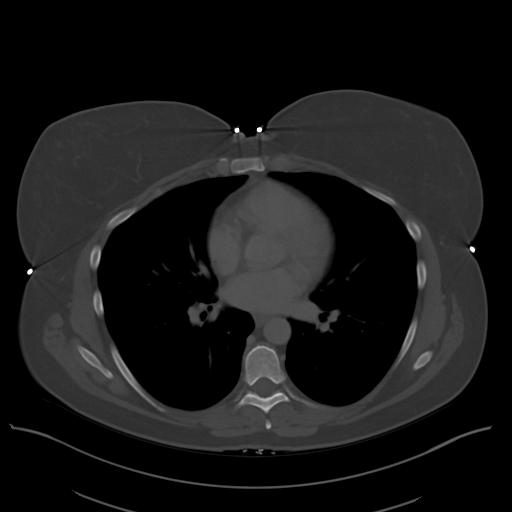
[im 107/129  soft-tissue]
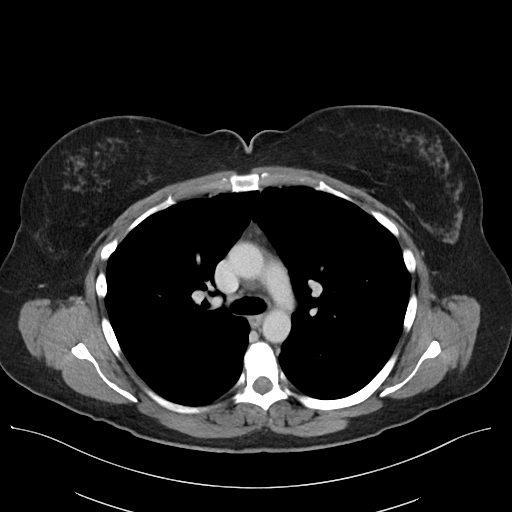
[im 118/129  soft-tissue]
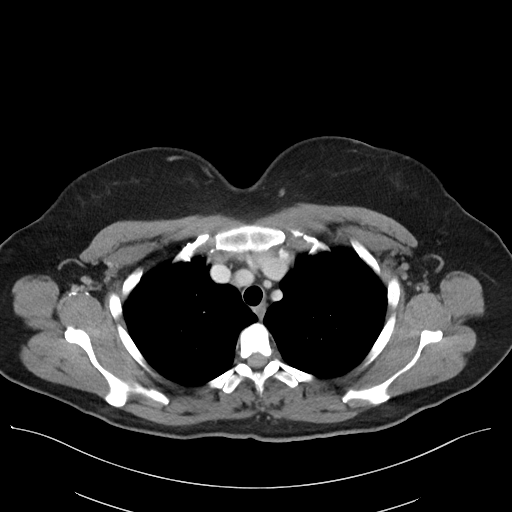

[Series 5: coronal · coronal · 0.91mm/px · 3 of 104 slices shown]
[im 35/104  soft-tissue]
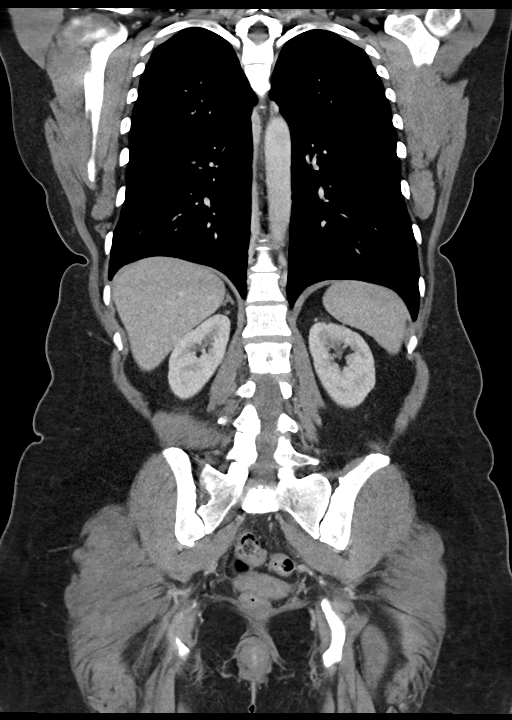
[im 46/104  soft-tissue]
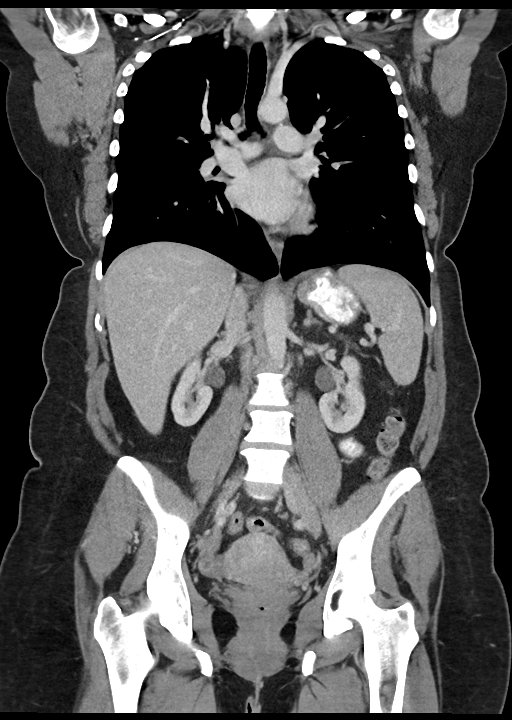
[im 58/104  soft-tissue]
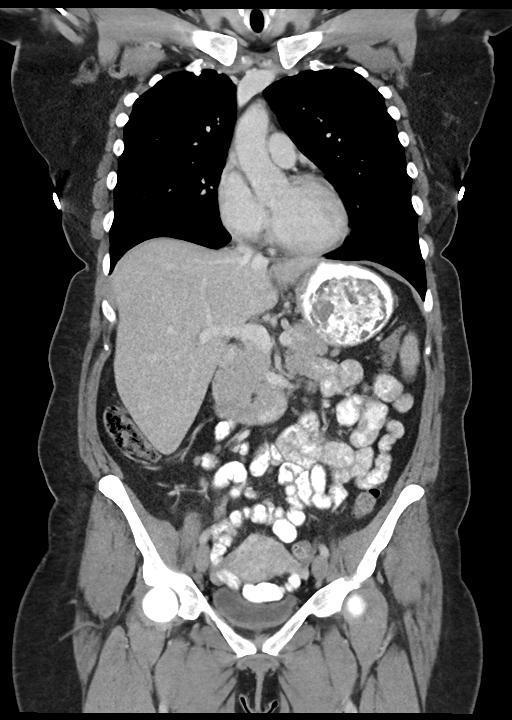

[14 of 46 positions shown; findings below may reference images not displayed]

RADIATION DOSE REDUCTION: This exam was performed according to the
departmental dose-optimization program which includes automated
exposure control, adjustment of the mA and/or kV according to
patient size and/or use of iterative reconstruction technique.

CONTRAST:  100mL OMNIPAQUE IOHEXOL 300 MG/ML SOLN, additional oral
enteric contrast
FINDINGS: CT CHEST FINDINGS

Cardiovascular: No significant vascular findings. Normal heart size.
No pericardial effusion.

Mediastinum/Nodes: No enlarged mediastinal, hilar, or axillary lymph
nodes. Thyroid gland, trachea, and esophagus demonstrate no
significant findings.

Lungs/Pleura: There is a single noncalcified nodule of the dependent
right lower lobe measuring 0.3 cm (series 4, image 81). Additional
punctuate, calcified nodule peripherally in the right lower lobe
(series 4, image 88). No pleural effusion or pneumothorax.

Musculoskeletal: No chest wall mass or suspicious osseous lesions
identified.

CT ABDOMEN PELVIS FINDINGS

Hepatobiliary: No solid liver abnormality is seen. Gallstones
contracted in the gallbladder fundus. No gallbladder wall
thickening, or biliary dilatation.

Pancreas: Unremarkable. No pancreatic ductal dilatation or
surrounding inflammatory changes.

Spleen: Normal in size without significant abnormality.

Adrenals/Urinary Tract: Adrenal glands are unremarkable. Kidneys are
normal, without renal calculi, solid lesion, or hydronephrosis.
Bladder is unremarkable.

Stomach/Bowel: Stomach is within normal limits. Appendix is not
clearly visualized. No evidence of bowel wall thickening,
distention, or inflammatory changes.

Vascular/Lymphatic: No significant vascular findings are present. No
enlarged abdominal or pelvic lymph nodes.

Reproductive: No mass or other abnormality.

Other: No abdominal wall hernia or abnormality. No ascites.

Musculoskeletal: No acute osseous findings.
IMPRESSION: 1. No definite evidence of lymphadenopathy or metastatic disease in
the chest, abdomen, or pelvis.
2. Single noncalcified pulmonary nodule of the right lower lobe
measuring 0.3 cm, almost certainly benign and incidental sequelae of
prior infection or inflammation. Attention on follow-up.
3. Reported colon mass is not appreciated by CT.
4. Cholelithiasis.

## 2022-07-11 ENCOUNTER — Encounter (INDEPENDENT_AMBULATORY_CARE_PROVIDER_SITE_OTHER): Payer: Managed Care, Other (non HMO) | Admitting: Ophthalmology

## 2022-07-11 DIAGNOSIS — H43813 Vitreous degeneration, bilateral: Secondary | ICD-10-CM | POA: Diagnosis not present

## 2022-07-12 ENCOUNTER — Ambulatory Visit
Admission: RE | Admit: 2022-07-12 | Discharge: 2022-07-12 | Disposition: A | Discharge status: Home / Self Care | Payer: Managed Care, Other (non HMO) | Source: Ambulatory Visit | Attending: Pediatrics | Admitting: Pediatrics

## 2022-07-12 ENCOUNTER — Other Ambulatory Visit: Payer: Self-pay | Admitting: Pediatrics

## 2022-07-12 DIAGNOSIS — J189 Pneumonia, unspecified organism: Secondary | ICD-10-CM

## 2022-09-18 ENCOUNTER — Telehealth: Payer: Self-pay | Admitting: Psychiatry

## 2022-09-18 NOTE — Telephone Encounter (Signed)
Pt lvm that she has been taking double the amount of the wellbutrin and not the buspar. She said the pills looked  look the same. So she needs to know what to do. Does she need to wean back down on the wellbutrin . Please give her a call at [phone]

## 2022-09-18 NOTE — Telephone Encounter (Signed)
Patient said she was double taking the Wellbutrin for 10 days and has not been taking the Buspar for 10 days by accident, said the pills looked similar. She has not taken any Wellbutrin since Friday on advice of pharmacist. She said she was having dry mouth, terrible headache, and was feeling anxious and she started reading about her medication and realized that she was taking it incorrectly. She reports other than this she doesn't feel bad, but wants to know how to take the Wellbutrin and Buspar.

## 2022-09-18 NOTE — Telephone Encounter (Signed)
Doubling up on the Wellbutrin will definitely give you those side effects that she noticed.  However she should be able to start back to 1 a day of the Wellbutrin XL 300 mg and probably tolerated just fine. Regarding buspirone she needs to start back at half tablet twice daily for 5 days then she can go to 1 tablet twice a day.  Starting that back and full dose will sometimes cause nausea or dizziness.

## 2022-09-18 NOTE — Telephone Encounter (Signed)
Patient notified of recommendations. 

## 2022-10-26 ENCOUNTER — Other Ambulatory Visit: Payer: Self-pay | Admitting: Psychiatry

## 2022-10-26 DIAGNOSIS — F4001 Agoraphobia with panic disorder: Secondary | ICD-10-CM

## 2022-10-26 DIAGNOSIS — F4310 Post-traumatic stress disorder, unspecified: Secondary | ICD-10-CM

## 2022-10-26 DIAGNOSIS — F3342 Major depressive disorder, recurrent, in full remission: Secondary | ICD-10-CM

## 2022-11-07 ENCOUNTER — Encounter: Payer: Self-pay | Admitting: Oncology

## 2022-11-23 ENCOUNTER — Telehealth: Payer: Self-pay | Admitting: Oncology

## 2022-11-23 ENCOUNTER — Inpatient Hospital Stay: Payer: Managed Care, Other (non HMO)

## 2022-11-23 ENCOUNTER — Encounter: Payer: Self-pay | Admitting: *Deleted

## 2022-11-23 ENCOUNTER — Inpatient Hospital Stay: Payer: Managed Care, Other (non HMO) | Admitting: Oncology

## 2022-11-23 NOTE — Progress Notes (Signed)
"  No show" for lab/OV today. Scheduling message sent to reschedule in ~ 1 month.

## 2022-12-19 ENCOUNTER — Inpatient Hospital Stay: Payer: Managed Care, Other (non HMO)

## 2022-12-19 ENCOUNTER — Inpatient Hospital Stay: Payer: Managed Care, Other (non HMO) | Admitting: Oncology

## 2023-02-25 ENCOUNTER — Other Ambulatory Visit: Payer: Self-pay | Admitting: Psychiatry

## 2023-02-25 DIAGNOSIS — F3342 Major depressive disorder, recurrent, in full remission: Secondary | ICD-10-CM

## 2023-05-22 ENCOUNTER — Other Ambulatory Visit: Payer: Self-pay | Admitting: Psychiatry

## 2023-05-22 DIAGNOSIS — F4310 Post-traumatic stress disorder, unspecified: Secondary | ICD-10-CM

## 2023-05-22 DIAGNOSIS — F4001 Agoraphobia with panic disorder: Secondary | ICD-10-CM

## 2023-06-29 ENCOUNTER — Other Ambulatory Visit: Payer: Self-pay | Admitting: Psychiatry

## 2023-06-29 DIAGNOSIS — F4310 Post-traumatic stress disorder, unspecified: Secondary | ICD-10-CM

## 2023-08-21 ENCOUNTER — Other Ambulatory Visit: Payer: Self-pay | Admitting: Psychiatry

## 2023-08-21 DIAGNOSIS — F3342 Major depressive disorder, recurrent, in full remission: Secondary | ICD-10-CM

## 2023-08-29 ENCOUNTER — Encounter: Payer: Self-pay | Admitting: Psychiatry

## 2023-08-29 ENCOUNTER — Telehealth (INDEPENDENT_AMBULATORY_CARE_PROVIDER_SITE_OTHER): Admitting: Psychiatry

## 2023-08-29 DIAGNOSIS — F902 Attention-deficit hyperactivity disorder, combined type: Secondary | ICD-10-CM | POA: Diagnosis not present

## 2023-08-29 DIAGNOSIS — F4001 Agoraphobia with panic disorder: Secondary | ICD-10-CM | POA: Diagnosis not present

## 2023-08-29 DIAGNOSIS — F3342 Major depressive disorder, recurrent, in full remission: Secondary | ICD-10-CM | POA: Diagnosis not present

## 2023-08-29 DIAGNOSIS — F4310 Post-traumatic stress disorder, unspecified: Secondary | ICD-10-CM

## 2023-08-29 MED ORDER — METHYLPHENIDATE HCL ER (OSM) 36 MG PO TBCR
36.0000 mg | EXTENDED_RELEASE_TABLET | Freq: Every day | ORAL | 0 refills | Status: DC
Start: 2023-08-29 — End: 2023-09-26

## 2023-08-29 NOTE — Progress Notes (Signed)
 Christy Adams [id number] Oct 12, 1970 53 y.o.   Video Visit via My Chart  I connected with pt by My Chart and verified that I am speaking with the correct person using two identifiers.   I discussed the limitations, risks, security and privacy concerns of performing an evaluation and management service by My Chart  and the availability of in person appointments. I also discussed with the patient that there may be a patient responsible charge related to this service. The patient expressed understanding and agreed to proceed.  I discussed the assessment and treatment plan with the patient. The patient was provided an opportunity to ask questions and all were answered. The patient agreed with the plan and demonstrated an understanding of the instructions.   The patient was advised to call back or seek an in-person evaluation if the symptoms worsen or if the condition fails to improve as anticipated.  I provided 30 minutes of video time during this encounter.  The patient was located at home and the provider was located office. Session from [id number]  Subjective:   Patient ID:  Christy Adams is a 53 y.o. (DOB 1971-03-06) female.  Chief Complaint:  Chief Complaint  Patient presents with   Follow-up   Anxiety   Stress    HPI Christy Adams presents to the office today for follow-up of anxiety and depression.  visit August 20, 2018 & 02/2019.  No meds were changed.  08-26-20 appt noted:  Mother died 08/10/2019 from massive stroke.  Hemorrhagic. Can't cry very easily.  Wonders if sertraline interferes.  Wonders if it interferes with grief and she'll be having more problems later.  Doesn't feel like she's holding emotions back.   Has not felt flat before and has felt good emotionally.  Anxiety managed.   Good job supportive. Son to college in fall. Sisters and mother also on sertraline and Wellbutrin. pLAN: OK trial reduction in sertraline to 150 mg to see if grief tears are easier to  come and history of stability in past on 150 mg daily. Continue buspirone 30 mg twice daily for PTSD and augmentation for depression Continue Wellbutrin XL 300 mg a.m. for depression and a history of ADD symptoms  10/20/20 TC:  wanted med change.  Increased sertraline back to 200 mg daily.  07/12/2021 appt noted: Has been trying to stick with sertraline 150 mg daily.  Occ misses a day. Has been runnin gout of buspar and didn't feel good. On way hand function well.  Hard year bc losing mother.   Still don't cry easily on sertraline and doesn't like it. Restarting therapy. Overall feels ok with meds. No sig anxiety but a lot of emotional things and I feel detached. Not hlpeless.  Bluntness can be surprising. Sister health issues.  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 Plan: BC blunted with sertraline:  Reduce sertraline to 1 tablet daily for 5 days, then start fluoxetine 1 daily and reduce sertraline to 1/2 tablet for 1 week, then stop sertraline and continue 1 fluoxetine daily Continue buspirone 30 mg twice daily for PTSD and augmentation for depression Continue Wellbutrin XL 300 mg a.m. for depression and a history of ADD symptoms  09/07/2021 phone call complaining that anxiety is worse on fluoxetine and she will go back to sertraline. MD response:We switched from sertraline 2 months ago.  Her anxiety is worse on the fluoxetine.  This is a known risk and I agree with her that switching back to sertraline makes the most sense.  However reduce fluoxetine to 20 mg daily or 40 mg every other day and start sertraline 1/2 tablet of the 100 mg tablet size for 5 days, then continue fluoxetine 20 mg daily and increase sertraline to 1 daily for 5 days, then stop fluoxetine and increase sertraline to 1-1/2 daily.  Please send in a prescription for sertraline 100 mg tablets 1-1/2 daily #45 and 1 refill  10/11/21 appt noted: Switched back to sertraline and 90% better.  More willing to put up with the flat SE  from sertraline.  Anxiety unmanageable with fluoxetine.  On sertraline 150 mg daily.  Astonishing how much better I am.  Felt terrible on fluoxetine. Patient reports stable mood and denies depressed or irritable moods.  Patient denies any recent difficulty with anxiety.  Patient denies difficulty with sleep initiation or maintenance. Denies appetite disturbance.  Patient reports that energy and motivation have been good.  Patient denies any difficulty with concentration.  Patient denies any suicidal ideation. Plan: Worse with switch to fluoxetine Continue sertraline 150 mg daily Continue buspirone 30 mg twice daily for PTSD and augmentation for depression Continue Wellbutrin XL 300 mg a.m. for depression and a history of ADD symptoms  04/18/22 appt noted: Things are good.  More like herself with a range of emotions.  Ablet to weep. Had colon cancer on first screening colonoscopy in May.  Stage 1 resection. And no further tx needed.  At [facility] and in study.  Did CBT for pain management and it was great.   It also helped her anxiety and included PMR and other techniques.  Gave her more control. No more pain and cancer free. In [place] support group. Moving parents. No SE Patient reports stable mood and denies depressed or irritable moods.  Patient denies any recent difficulty with anxiety.  Patient denies difficulty with sleep initiation or maintenance. Denies appetite disturbance.  Patient reports that energy and motivation have been good.  Patient denies any difficulty with concentration.  Patient denies any suicidal ideation.  08/29/23 appt noted: Med: Wellbutrin XL 300 AM, buspirone 30 HS, sertraline 150 mg daily. Piper ADHD and mind quieted quickly after starting Concerta.  Markedly better.  53 yo seeing Dr. Stevphen [place].  Her anxiety is much better too on the Concerta.  Makes her wonder about herself for ADD.  Wishes she could have dx D earlier.   Nothing else worked for [employer]. Pt going through  perimenopause, started 200 mg progesterone bc excessive crying and it helped.   Still some perimenopausal sx.   In counseling for ptsd like sx dealing with Piper's sx. Functions fine.   Not much residual anxiety.  Rarely feel anxious.  Not having the thoughts piper did.   Not sad significantly. Pt notes some tasks super focused and other tasks not.  Will finish sentences of other s which is annoying. Terrible distracted driver and others notice.  Distracted by everything driving.  It's not hard enough of activity to keep her focus.  Trouble remembering calendar.  2 cups coffee in the AM  Past Psychiatric Medication Trials: Sertraline 200 (on Zoloft 20 years) fluoxetine no response in early 20's, retry 2023 NR after 6 weeks and anxiety unmanageable   Wellbutrin XL 300,  buspirone 30 twice daily,  clonazepam, lorazepam,  Adderall XR anxiety;  no history of Concerta  Piper D seeing [employer];  markedly better with Concerta  Review of Systems:  Review of Systems  Respiratory:  Negative for cough.   Cardiovascular:  Negative for  chest pain and palpitations.  Neurological:  Negative for tremors.  Psychiatric/Behavioral:  Positive for decreased concentration. Negative for agitation, behavioral problems, confusion, dysphoric mood, hallucinations, self-injury, sleep disturbance and suicidal ideas. The patient is not nervous/anxious and is not hyperactive.     Medications: I have reviewed the patient's current medications.  Current Outpatient Medications  Medication Sig Dispense Refill   busPIRone (BUSPAR) 30 MG tablet TAKE 1 TABLET BY MOUTH AT BEDTIME. 90 tablet 3   COVID-19 mRNA vaccine 2023-2024 (COMIRNATY) syringe Inject into the muscle. 0.3 mL 0   methylphenidate (CONCERTA) 36 MG PO CR tablet Take 1 tablet (36 mg total) by mouth daily. 30 tablet 0   montelukast (SINGULAIR) 10 MG tablet TK 1 T PO QHS     prednisoLONE acetate (PRED FORTE) 1 % ophthalmic suspension Place 1 drop into the  left eye 4 (four) times daily.     progesterone (PROMETRIUM) 200 MG capsule Take 200 mg by mouth daily.     sertraline (ZOLOFT) 100 MG tablet TAKE 1.5 TABLETS (150 MG TOTAL) BY MOUTH DAILY 135 tablet 3   No current facility-administered medications for this visit.    Medication Side Effects: None  Allergies: No Known Allergies  Past Medical History:  Diagnosis Date   Abnormal Pap smear of cervix    Allergy    Anemia    Anxiety    Asthma    allergic to dust - only uses inhaler prn - rarely   Depression    Headache    otc med prn - rare   HPV in female    Panic attacks    PONV (postoperative nausea and vomiting)    Sleep apnea    no CPAP   SVD (spontaneous vaginal delivery)    x 3    Family History  Problem Relation Age of Onset   Heart disease Mother    Depression Mother    Hypertension Mother    Thyroid disease Mother    Breast cancer Mother 27   Colon polyps Mother    Heart attack Father 62   Psoriasis Sister    Skin cancer Maternal Aunt    Heart failure Maternal Grandmother    Diabetes Maternal Grandfather    Heart attack Maternal Grandfather     Social History   Socioeconomic History   Marital status: Divorced    Spouse name: Not on file   Number of children: Not on file   Years of education: Not on file   Highest education level: Not on file  Occupational History   Not on file  Tobacco Use   Smoking status: Never   Smokeless tobacco: Never  Vaping Use   Vaping status: Never Used  Substance and Sexual Activity   Alcohol use: Yes    Comment: wine at night 2-3 days per week.    Drug use: No   Sexual activity: Not Currently    Partners: Male    Comment: ablation and BTL  Other Topics Concern   Not on file  Social History Narrative   Not on file   Social Drivers of Health   Financial Resource Strain: Not on file  Food Insecurity: Not on file  Transportation Needs: Not on file  Physical Activity: Not on file  Stress: Not on file  Social  Connections: Not on file  Intimate Partner Violence: Not on file    Past Medical History, Surgical history, Social history, and Family history were reviewed and updated as appropriate.   Please  see review of systems for further details on the patient's review from today.   Objective:   Physical Exam:  There were no vitals taken for this visit.  Physical Exam Constitutional:      General: She is not in acute distress. Musculoskeletal:        General: No deformity.  Neurological:     Mental Status: She is alert and oriented to person, place, and time.     Cranial Nerves: No dysarthria.     Coordination: Coordination normal.  Psychiatric:        Attention and Perception: Attention and perception normal. She does not perceive auditory or visual hallucinations.        Mood and Affect: Mood normal. Mood is not anxious or depressed. Affect is not labile, blunt or inappropriate.        Speech: Speech normal.        Behavior: Behavior normal. Behavior is cooperative.        Thought Content: Thought content normal. Thought content is not delusional. Thought content does not include homicidal or suicidal ideation. Thought content does not include suicidal plan.        Cognition and Memory: Cognition and memory normal.        Judgment: Judgment normal.     Comments: Anxiety back under control on sertraline    Lab Review:     Component Value Date/Time   NA 139 05/08/2022 1330   K 4.1 05/08/2022 1330   CL 104 05/08/2022 1330   CO2 28 05/08/2022 1330   GLUCOSE 93 05/08/2022 1330   BUN 17 05/08/2022 1330   CREATININE 1.12 (H) 05/08/2022 1330   CREATININE 0.83 03/14/2017 1042   CALCIUM 9.3 05/08/2022 1330   PROT 6.9 03/14/2017 1042   AST 21 03/14/2017 1042   ALT 28 03/14/2017 1042   BILITOT 0.3 03/14/2017 1042   GFRNONAA >60 05/08/2022 1330       Component Value Date/Time   WBC 7.2 03/14/2017 1042   RBC 5.18 (H) 03/14/2017 1042   HGB 13.1 03/14/2017 1042   HCT 40.9 03/14/2017  1042   PLT 280 03/14/2017 1042   MCV 79.0 (L) 03/14/2017 1042   MCH 25.3 (L) 03/14/2017 1042   MCHC 32.0 03/14/2017 1042   RDW 13.3 03/14/2017 1042   LYMPHSABS 1,865 03/14/2017 1042   EOSABS 367 03/14/2017 1042   BASOSABS 86 03/14/2017 1042    No results found for: "POCLITH", "LITHIUM"   No results found for: "PHENYTOIN", "PHENOBARB", "VALPROATE", "CBMZ"   .res Asse2ssment: Plan:    Recurrent major depression in full remission (HCC)  Attention deficit hyperactivity disorder (ADHD), combined type - Plan: methylphenidate (CONCERTA) 36 MG PO CR tablet  PTSD (post-traumatic stress disorder)  Panic disorder with agoraphobia  History of ADD  Good response to meds.  We discussed at length how sertraline can sometimes have somewhat of a numbing effect.  However she has not typically experience that effect.  Discussed that this is dose related.  She reports each she has been stable on a lower dose of sertraline 150 mg but still can't cry.  Has not been having recent panic or anxiety or depressive symptoms.  Discussed the risk of relapse with reduction or change in sertraline.    Anxiety and depression are under good control.  Buspirone in particular helps anxiety.  She notices more when she tends to run out.  Worse with switch to fluoxetine  Continue sertraline 150 mg daily Continue buspirone 30 mg twice daily  for PTSD and augmentation for depression  DT hx ADD and D;s dramatic benefit Concerta and pt's ADD sx affecting driving it is reasonable to try Concerta to further improve function Stop Wellbutrin  Start Concerta 36 AM  Discussed potential benefits, risks, and side effects of stimulants with patient to include increased heart rate, palpitations, insomnia, increased anxiety, increased irritability, or decreased appetite.  Instructed patient to contact office if experiencing any significant tolerability issues.  Disc alternativve of Lexapro or Celexa but less dosing flexibility  and greater risk of wt gain usually.  Defer  Follow-up 1 months  Meredith Staggers, MD, DFAPA  Please see After Visit Summary for patient specific instructions.  Future Appointments  Date Time Provider Department Center  09/26/2023  2:30 PM Cottle, Steva Ready., MD [facility] None    No orders of the defined types were placed in this encounter.     -------------------------------

## 2023-09-26 ENCOUNTER — Encounter: Payer: Self-pay | Admitting: Psychiatry

## 2023-09-26 ENCOUNTER — Telehealth (INDEPENDENT_AMBULATORY_CARE_PROVIDER_SITE_OTHER): Payer: Self-pay | Admitting: Psychiatry

## 2023-09-26 DIAGNOSIS — F3342 Major depressive disorder, recurrent, in full remission: Secondary | ICD-10-CM

## 2023-09-26 DIAGNOSIS — F4310 Post-traumatic stress disorder, unspecified: Secondary | ICD-10-CM | POA: Diagnosis not present

## 2023-09-26 DIAGNOSIS — F902 Attention-deficit hyperactivity disorder, combined type: Secondary | ICD-10-CM

## 2023-09-26 DIAGNOSIS — F4001 Agoraphobia with panic disorder: Secondary | ICD-10-CM | POA: Diagnosis not present

## 2023-09-26 MED ORDER — METHYLPHENIDATE HCL ER (OSM) 54 MG PO TBCR
54.0000 mg | EXTENDED_RELEASE_TABLET | Freq: Every day | ORAL | 0 refills | Status: DC
Start: 2023-09-26 — End: 2023-10-24

## 2023-09-26 NOTE — Progress Notes (Signed)
 Christy Adams [id number] 11-01-1970 53 y.o.   Video Visit via My Chart  I connected with pt by My Chart video and verified that I am speaking with the correct person using two identifiers.   I discussed the limitations, risks, security and privacy concerns of performing an evaluation and management service by My Chart  and the availability of in person appointments. I also discussed with the patient that there may be a patient responsible charge related to this service. The patient expressed understanding and agreed to proceed.  I discussed the assessment and treatment plan with the patient. The patient was provided an opportunity to ask questions and all were answered. The patient agreed with the plan and demonstrated an understanding of the instructions.   The patient was advised to call back or seek an in-person evaluation if the symptoms worsen or if the condition fails to improve as anticipated.  I provided 30 minutes of video time during this encounter.  The patient was located at home and the provider was located office. Session from [phone]  Subjective:   Patient ID:  Christy Adams is a 53 y.o. (DOB 1970/08/27) female.  Chief Complaint:  Chief Complaint  Patient presents with   Follow-up   Depression   Anxiety   ADD    HPI Christy Adams presents to the office today for follow-up of anxiety and depression.  visit August 20, 2018 & 02/2019.  No meds were changed.  08/21/2020 appt noted:  Mother died 08/05/2019 from massive stroke.  Hemorrhagic. Can't cry very easily.  Wonders if sertraline interferes.  Wonders if it interferes with grief and she'll be having more problems later.  Doesn't feel like she's holding emotions back.   Has not felt flat before and has felt good emotionally.  Anxiety managed.   Good job supportive. Son to college in fall. Sisters and mother also on sertraline and Wellbutrin. pLAN: OK trial reduction in sertraline to 150 mg to see if grief  tears are easier to come and history of stability in past on 150 mg daily. Continue buspirone 30 mg twice daily for PTSD and augmentation for depression Continue Wellbutrin XL 300 mg a.m. for depression and a history of ADD symptoms  10/20/20 TC:  wanted med change.  Increased sertraline back to 200 mg daily.  07/12/2021 appt noted: Has been trying to stick with sertraline 150 mg daily.  Occ misses a day. Has been runnin gout of buspar and didn't feel good. On way hand function well.  Hard year bc losing mother.   Still don't cry easily on sertraline and doesn't like it. Restarting therapy. Overall feels ok with meds. No sig anxiety but a lot of emotional things and I feel detached. Not hlpeless.  Bluntness can be surprising. Sister health issues.  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 Plan: BC blunted with sertraline:  Reduce sertraline to 1 tablet daily for 5 days, then start fluoxetine 1 daily and reduce sertraline to 1/2 tablet for 1 week, then stop sertraline and continue 1 fluoxetine daily Continue buspirone 30 mg twice daily for PTSD and augmentation for depression Continue Wellbutrin XL 300 mg a.m. for depression and a history of ADD symptoms  09/07/2021 phone call complaining that anxiety is worse on fluoxetine and she will go back to sertraline. MD response:We switched from sertraline 2 months ago.  Her anxiety is worse on the fluoxetine.  This is a known risk and I agree with her that switching back to sertraline  makes the most sense.  However reduce fluoxetine to 20 mg daily or 40 mg every other day and start sertraline 1/2 tablet of the 100 mg tablet size for 5 days, then continue fluoxetine 20 mg daily and increase sertraline to 1 daily for 5 days, then stop fluoxetine and increase sertraline to 1-1/2 daily.  Please send in a prescription for sertraline 100 mg tablets 1-1/2 daily #45 and 1 refill  10/11/21 appt noted: Switched back to sertraline and 90% better.  More willing to put up  with the flat SE from sertraline.  Anxiety unmanageable with fluoxetine.  On sertraline 150 mg daily.  Astonishing how much better I am.  Felt terrible on fluoxetine. Patient reports stable mood and denies depressed or irritable moods.  Patient denies any recent difficulty with anxiety.  Patient denies difficulty with sleep initiation or maintenance. Denies appetite disturbance.  Patient reports that energy and motivation have been good.  Patient denies any difficulty with concentration.  Patient denies any suicidal ideation. Plan: Worse with switch to fluoxetine Continue sertraline 150 mg daily Continue buspirone 30 mg twice daily for PTSD and augmentation for depression Continue Wellbutrin XL 300 mg a.m. for depression and a history of ADD symptoms  04/18/22 appt noted: Things are good.  More like herself with a range of emotions.  Ablet to weep. Had colon cancer on first screening colonoscopy in May.  Stage 1 resection. And no further tx needed.  At [facility] and in study.  Did CBT for pain management and it was great.   It also helped her anxiety and included PMR and other techniques.  Gave her more control. No more pain and cancer free. In [place] support group. Moving parents. No SE Patient reports stable mood and denies depressed or irritable moods.  Patient denies any recent difficulty with anxiety.  Patient denies difficulty with sleep initiation or maintenance. Denies appetite disturbance.  Patient reports that energy and motivation have been good.  Patient denies any difficulty with concentration.  Patient denies any suicidal ideation.  08/29/23 appt noted: Med: Wellbutrin XL 300 AM, buspirone 30 HS, sertraline 150 mg daily. Piper ADHD and mind quieted quickly after starting Concerta.  Markedly better.  53 yo seeing Dr. Stevphen [place].  Her anxiety is much better too on the Concerta.  Makes her wonder about herself for ADD.  Wishes she could have dx D earlier.   Nothing else worked for [employer]. Pt  going through perimenopause, started 200 mg progesterone bc excessive crying and it helped.   Still some perimenopausal sx.   In counseling for ptsd like sx dealing with Piper's sx. Functions fine.   Not much residual anxiety.  Rarely feel anxious.  Not having the thoughts piper did.   Not sad significantly. Pt notes some tasks super focused and other tasks not.  Will finish sentences of other s which is annoying. Terrible distracted driver and others notice.  Distracted by everything driving.  It's not hard enough of activity to keep her focus.  Trouble remembering calendar.  2 cups coffee in the AM Plan: Continue sertraline 150 mg daily Continue buspirone 30 mg twice daily for PTSD and augmentation for depression DT hx ADD and D;s dramatic benefit Concerta and pt's ADD sx affecting driving it is reasonable to try Concerta to further improve function Stop Wellbutrin  Start Concerta 36 AM  09/26/23 appt noted: Med; buspirone 30 HS, sertraline 150 mg daily, Concerta 36 mg AM No anxiety with Concerta.  But not much benefit  vs Wellbutrin. Started HRT.  Helped tearfulness which was not connected with sadness. No SE In past had severe dep and values sertraline.  Patient reports stable mood and denies depressed or irritable moods.  Mild anxiety.   Some sleep issues kind of light sleep.    Denies appetite disturbance.  Patient reports that energy and motivation have been good.  Patient denies any difficulty with concentration.  Patient denies any suicidal ideation. Questions re: D's psych med.    Past Psychiatric Medication Trials: Sertraline 200 (on Zoloft 20 years) fluoxetine no response in early 20's, retry 2023 NR after 6 weeks and anxiety unmanageable   Wellbutrin XL 300,  buspirone 30 twice daily,  clonazepam, lorazepam,  Adderall XR anxiety;   Concerta 36 mg aM  Piper D seeing [employer];  markedly better with Concerta  Review of Systems:  Review of Systems  Respiratory:  Negative  for cough.   Cardiovascular:  Negative for chest pain and palpitations.  Neurological:  Negative for tremors.  Psychiatric/Behavioral:  Positive for decreased concentration. Negative for agitation, behavioral problems, confusion, dysphoric mood, hallucinations, self-injury, sleep disturbance and suicidal ideas. The patient is not nervous/anxious and is not hyperactive.     Medications: I have reviewed the patient's current medications.  Current Outpatient Medications  Medication Sig Dispense Refill   busPIRone (BUSPAR) 30 MG tablet TAKE 1 TABLET BY MOUTH AT BEDTIME. 90 tablet 3   montelukast (SINGULAIR) 10 MG tablet TK 1 T PO QHS     prednisoLONE acetate (PRED FORTE) 1 % ophthalmic suspension Place 1 drop into the left eye 4 (four) times daily.     progesterone (PROMETRIUM) 200 MG capsule Take 200 mg by mouth daily.     sertraline (ZOLOFT) 100 MG tablet TAKE 1.5 TABLETS (150 MG TOTAL) BY MOUTH DAILY 135 tablet 3   methylphenidate (CONCERTA) 54 MG PO CR tablet Take 1 tablet (54 mg total) by mouth daily. 30 tablet 0   No current facility-administered medications for this visit.    Medication Side Effects: None  Allergies: No Known Allergies  Past Medical History:  Diagnosis Date   Abnormal Pap smear of cervix    Allergy    Anemia    Anxiety    Asthma    allergic to dust - only uses inhaler prn - rarely   Depression    Headache    otc med prn - rare   HPV in female    Panic attacks    PONV (postoperative nausea and vomiting)    Sleep apnea    no CPAP   SVD (spontaneous vaginal delivery)    x 3    Family History  Problem Relation Age of Onset   Heart disease Mother    Depression Mother    Hypertension Mother    Thyroid disease Mother    Breast cancer Mother 52   Colon polyps Mother    Heart attack Father 53   Psoriasis Sister    Skin cancer Maternal Aunt    Heart failure Maternal Grandmother    Diabetes Maternal Grandfather    Heart attack Maternal Grandfather      Social History   Socioeconomic History   Marital status: Divorced    Spouse name: Not on file   Number of children: Not on file   Years of education: Not on file   Highest education level: Not on file  Occupational History   Not on file  Tobacco Use   Smoking status: Never  Smokeless tobacco: Never  Vaping Use   Vaping status: Never Used  Substance and Sexual Activity   Alcohol use: Yes    Comment: wine at night 2-3 days per week.    Drug use: No   Sexual activity: Not Currently    Partners: Male    Comment: ablation and BTL  Other Topics Concern   Not on file  Social History Narrative   Not on file   Social Drivers of Health   Financial Resource Strain: Not on file  Food Insecurity: Not on file  Transportation Needs: Not on file  Physical Activity: Not on file  Stress: Not on file  Social Connections: Not on file  Intimate Partner Violence: Not on file    Past Medical History, Surgical history, Social history, and Family history were reviewed and updated as appropriate.   Please see review of systems for further details on the patient's review from today.   Objective:   Physical Exam:  There were no vitals taken for this visit.  Physical Exam Constitutional:      General: She is not in acute distress. Musculoskeletal:        General: No deformity.  Neurological:     Mental Status: She is alert and oriented to person, place, and time.     Cranial Nerves: No dysarthria.     Coordination: Coordination normal.  Psychiatric:        Attention and Perception: Attention and perception normal. She does not perceive auditory or visual hallucinations.        Mood and Affect: Mood normal. Mood is not anxious or depressed. Affect is not labile, blunt or inappropriate.        Speech: Speech normal.        Behavior: Behavior normal. Behavior is cooperative.        Thought Content: Thought content normal. Thought content is not delusional. Thought content does not  include homicidal or suicidal ideation. Thought content does not include suicidal plan.        Cognition and Memory: Cognition and memory normal.        Judgment: Judgment normal.     Comments: Anxiety back under control on sertraline    Lab Review:     Component Value Date/Time   NA 139 05/08/2022 1330   K 4.1 05/08/2022 1330   CL 104 05/08/2022 1330   CO2 28 05/08/2022 1330   GLUCOSE 93 05/08/2022 1330   BUN 17 05/08/2022 1330   CREATININE 1.12 (H) 05/08/2022 1330   CREATININE 0.83 03/14/2017 1042   CALCIUM 9.3 05/08/2022 1330   PROT 6.9 03/14/2017 1042   AST 21 03/14/2017 1042   ALT 28 03/14/2017 1042   BILITOT 0.3 03/14/2017 1042   GFRNONAA >60 05/08/2022 1330       Component Value Date/Time   WBC 7.2 03/14/2017 1042   RBC 5.18 (H) 03/14/2017 1042   HGB 13.1 03/14/2017 1042   HCT 40.9 03/14/2017 1042   PLT 280 03/14/2017 1042   MCV 79.0 (L) 03/14/2017 1042   MCH 25.3 (L) 03/14/2017 1042   MCHC 32.0 03/14/2017 1042   RDW 13.3 03/14/2017 1042   LYMPHSABS 1,865 03/14/2017 1042   EOSABS 367 03/14/2017 1042   BASOSABS 86 03/14/2017 1042    No results found for: "POCLITH", "LITHIUM"   No results found for: "PHENYTOIN", "PHENOBARB", "VALPROATE", "CBMZ"   .res Asse2ssment: Plan:    Recurrent major depression in full remission (HCC)  PTSD (post-traumatic stress disorder)  Attention deficit  hyperactivity disorder (ADHD), combined type - Plan: methylphenidate (CONCERTA) 54 MG PO CR tablet  Panic disorder with agoraphobia  History of ADD  Good response to meds.  We discussed at length how sertraline can sometimes have somewhat of a numbing effect.  However she has not typically experience that effect.  Discussed that this is dose related.  She reports each she has been stable on a lower dose of sertraline 150 mg but still can't cry.  Has not been having recent panic or anxiety or depressive symptoms.  Discussed the risk of relapse with reduction or change in  sertraline.    Anxiety and depression are under good control.  Buspirone in particular helps anxiety.  She notices more when she tends to run out.  Worse with switch to fluoxetine  Continue sertraline 150 mg daily Continue buspirone 30 mg twice daily for PTSD and augmentation for depression.  Could consider stopping if Concerta helps her.  DT hx ADD and D;s dramatic benefit Concerta and pt's ADD sx affecting driving it is reasonable to try Concerta to further improve function  Increase Concerta 54 AM  Discussed potential benefits, risks, and side effects of stimulants with patient to include increased heart rate, palpitations, insomnia, increased anxiety, increased irritability, or decreased appetite.  Instructed patient to contact office if experiencing any significant tolerability issues.  Disc alternativve of Lexapro or Celexa but less dosing flexibility and greater risk of wt gain usually.  Defer  Follow-up 2 mos  Meredith Staggers, MD, DFAPA  Please see After Visit Summary for patient specific instructions.  No future appointments.   No orders of the defined types were placed in this encounter.     -------------------------------

## 2023-10-22 ENCOUNTER — Telehealth: Payer: Self-pay | Admitting: Psychiatry

## 2023-10-22 NOTE — Telephone Encounter (Signed)
 LF 4/2, due 4/30

## 2023-10-22 NOTE — Telephone Encounter (Signed)
 Pt called asking for a refill on her concerta  54 mg. Pharmacy is [facility] [address]. Next appt is in june

## 2023-10-22 NOTE — Telephone Encounter (Signed)
 Pt is due for RF on 4/30. She was notified.

## 2023-10-24 ENCOUNTER — Other Ambulatory Visit: Payer: Self-pay

## 2023-10-24 DIAGNOSIS — F902 Attention-deficit hyperactivity disorder, combined type: Secondary | ICD-10-CM

## 2023-10-24 MED ORDER — METHYLPHENIDATE HCL ER (OSM) 54 MG PO TBCR: 54.0000 mg | EXTENDED_RELEASE_TABLET | Freq: Every day | ORAL | 0 refills | Status: DC

## 2023-10-24 NOTE — Telephone Encounter (Signed)
 Pended Concerta 54 mg to [facility] on [address].

## 2023-11-21 ENCOUNTER — Telehealth: Payer: Self-pay | Admitting: Psychiatry

## 2023-11-21 DIAGNOSIS — F4310 Post-traumatic stress disorder, unspecified: Secondary | ICD-10-CM

## 2023-11-21 MED ORDER — BUSPIRONE HCL 30 MG PO TABS: 30.0000 mg | ORAL_TABLET | Freq: Every day | ORAL | 0 refills | Status: DC

## 2023-11-21 NOTE — Telephone Encounter (Signed)
 Rx for 90-day supply of Buspar  sent to [facility].

## 2023-11-21 NOTE — Telephone Encounter (Signed)
 Jenae at [facility] LVM @ 9:45a stating that the pt would like to have the script for Buspar  30mg  sent to them for cheaper cost.   She said to send to [facility] [address], [place], [place].  [phone]  Next appt 6/12

## 2023-12-06 ENCOUNTER — Telehealth: Admitting: Psychiatry

## 2023-12-06 ENCOUNTER — Encounter: Payer: Self-pay | Admitting: Psychiatry

## 2023-12-06 DIAGNOSIS — F4001 Agoraphobia with panic disorder: Secondary | ICD-10-CM

## 2023-12-06 DIAGNOSIS — F902 Attention-deficit hyperactivity disorder, combined type: Secondary | ICD-10-CM | POA: Diagnosis not present

## 2023-12-06 DIAGNOSIS — F3342 Major depressive disorder, recurrent, in full remission: Secondary | ICD-10-CM

## 2023-12-06 DIAGNOSIS — F4310 Post-traumatic stress disorder, unspecified: Secondary | ICD-10-CM

## 2023-12-06 MED ORDER — METHYLPHENIDATE HCL ER (OSM) 54 MG PO TBCR: 54.0000 mg | EXTENDED_RELEASE_TABLET | Freq: Every day | ORAL | 0 refills | Status: DC

## 2023-12-06 MED ORDER — BUSPIRONE HCL 30 MG PO TABS: 30.0000 mg | ORAL_TABLET | Freq: Every day | ORAL | 1 refills | Status: AC

## 2023-12-06 NOTE — Progress Notes (Signed)
 Christy Adams [id number] Apr 01, 1971 53 y.o.   Video Visit via My Chart  I connected with pt by My Chart video and verified that I am speaking with the correct person using two identifiers.   I discussed the limitations, risks, security and privacy concerns of performing an evaluation and management service by My Chart  and the availability of in person appointments. I also discussed with the patient that there may be a patient responsible charge related to this service. The patient expressed understanding and agreed to proceed.  I discussed the assessment and treatment plan with the patient. The patient was provided an opportunity to ask questions and all were answered. The patient agreed with the plan and demonstrated an understanding of the instructions.   The patient was advised to call back or seek an in-person evaluation if the symptoms worsen or if the condition fails to improve as anticipated.  I provided 30 minutes of video time during this encounter.  The patient was located at home and the provider was located office. Session from [id number]  Subjective:   Patient ID:  Christy Adams is a 53 y.o. (DOB 12/12/1970) female.  Chief Complaint:  Chief Complaint  Patient presents with   Follow-up   Depression   Anxiety   ADD    HPI Christy Adams presents to the office today for follow-up of anxiety and depression.  visit August 20, 2018 & 02/2019.  No meds were changed.  2020-08-22 appt noted:  Mother died Aug 06, 2019 from massive stroke.  Hemorrhagic. Can't cry very easily.  Wonders if sertraline  interferes.  Wonders if it interferes with grief and she'll be having more problems later.  Doesn't feel like she's holding emotions back.   Has not felt flat before and has felt good emotionally.  Anxiety managed.   Good job supportive. Son to college in fall. Sisters and mother also on sertraline  and Wellbutrin . pLAN: OK trial reduction in sertraline  to 150 mg to see if grief  tears are easier to come and history of stability in past on 150 mg daily. Continue buspirone  30 mg twice daily for PTSD and augmentation for depression Continue Wellbutrin  XL 300 mg a.m. for depression and a history of ADD symptoms  10/20/20 TC:  wanted med change.  Increased sertraline  back to 200 mg daily.  07/12/2021 appt noted: Has been trying to stick with sertraline  150 mg daily.  Occ misses a day. Has been runnin gout of buspar  and didn't feel good. On way hand function well.  Hard year bc losing mother.   Still don't cry easily on sertraline  and doesn't like it. Restarting therapy. Overall feels ok with meds. No sig anxiety but a lot of emotional things and I feel detached. Not hlpeless.  Bluntness can be surprising. Sister health issues.  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 Plan: BC blunted with sertraline :  Reduce sertraline  to 1 tablet daily for 5 days, then start fluoxetine  1 daily and reduce sertraline  to 1/2 tablet for 1 week, then stop sertraline  and continue 1 fluoxetine  daily Continue buspirone  30 mg twice daily for PTSD and augmentation for depression Continue Wellbutrin  XL 300 mg a.m. for depression and a history of ADD symptoms  09/07/2021 phone call complaining that anxiety is worse on fluoxetine  and she will go back to sertraline . MD response:We switched from sertraline  2 months ago.  Her anxiety is worse on the fluoxetine .  This is a known risk and I agree with her that switching back to sertraline   makes the most sense.  However reduce fluoxetine  to 20 mg daily or 40 mg every other day and start sertraline  1/2 tablet of the 100 mg tablet size for 5 days, then continue fluoxetine  20 mg daily and increase sertraline  to 1 daily for 5 days, then stop fluoxetine  and increase sertraline  to 1-1/2 daily.  Please send in a prescription for sertraline  100 mg tablets 1-1/2 daily #45 and 1 refill  10/11/21 appt noted: Switched back to sertraline  and 90% better.  More willing to put up  with the flat SE from sertraline .  Anxiety unmanageable with fluoxetine .  On sertraline  150 mg daily.  Astonishing how much better I am.  Felt terrible on fluoxetine . Patient reports stable mood and denies depressed or irritable moods.  Patient denies any recent difficulty with anxiety.  Patient denies difficulty with sleep initiation or maintenance. Denies appetite disturbance.  Patient reports that energy and motivation have been good.  Patient denies any difficulty with concentration.  Patient denies any suicidal ideation. Plan: Worse with switch to fluoxetine  Continue sertraline  150 mg daily Continue buspirone  30 mg twice daily for PTSD and augmentation for depression Continue Wellbutrin  XL 300 mg a.m. for depression and a history of ADD symptoms  04/18/22 appt noted: Things are good.  More like herself with a range of emotions.  Ablet to weep. Had colon cancer on first screening colonoscopy in May.  Stage 1 resection. And no further tx needed.  At [facility] and in study.  Did CBT for pain management and it was great.   It also helped her anxiety and included PMR and other techniques.  Gave her more control. No more pain and cancer free. In [place] support group. Moving parents. No SE Patient reports stable mood and denies depressed or irritable moods.  Patient denies any recent difficulty with anxiety.  Patient denies difficulty with sleep initiation or maintenance. Denies appetite disturbance.  Patient reports that energy and motivation have been good.  Patient denies any difficulty with concentration.  Patient denies any suicidal ideation.  08/29/23 appt noted: Med: Wellbutrin  XL 300 AM, buspirone  30 HS, sertraline  150 mg daily. Christy ADHD and mind quieted quickly after starting Concerta .  Markedly better.  53 yo seeing Dr. Sheria Dills.  Her anxiety is much better too on the Concerta .  Makes her wonder about herself for ADD.  Wishes she could have dx Adams earlier.   Nothing else worked for [employer]. Pt  going through perimenopause, started 200 mg progesterone bc excessive crying and it helped.   Still some perimenopausal sx.   In counseling for ptsd like sx dealing with Christy's sx. Functions fine.   Not much residual anxiety.  Rarely feel anxious.  Not having the thoughts Christy did.   Not sad significantly. Pt notes some tasks super focused and other tasks not.  Will finish sentences of other s which is annoying. Terrible distracted driver and others notice.  Distracted by everything driving.  It's not hard enough of activity to keep her focus.  Trouble remembering calendar.  2 cups coffee in the AM Plan: Continue sertraline  150 mg daily Continue buspirone  30 mg twice daily for PTSD and augmentation for depression DT hx ADD and Adams;s dramatic benefit Concerta  and pt's ADD sx affecting driving it is reasonable to try Concerta  to further improve function Stop Wellbutrin   Start Concerta  36 AM  09/26/23 appt noted: Med; buspirone  30 HS, sertraline  150 mg daily, Concerta  36 mg AM No anxiety with Concerta .  But not much benefit  vs Wellbutrin . Started HRT.  Helped tearfulness which was not connected with sadness. No SE In past had severe dep and values sertraline .  Patient reports stable mood and denies depressed or irritable moods.  Mild anxiety.   Some sleep issues kind of light sleep.    Denies appetite disturbance.  Patient reports that energy and motivation have been good.  Patient denies any difficulty with concentration.  Patient denies any suicidal ideation. Questions re: Adams's psych med.   Plan: DT hx ADD and Adams;s dramatic benefit Concerta  and pt's ADD sx affecting driving it is reasonable to try Concerta  to further improve function Increase Concerta  54 AM  12/06/23 appt noted:  Med; buspirone  30 HS, sertraline  150 mg daily, Concerta  54 mg AM Great with increase Concerta .  Very pleased with benefit and doesn't want further change. No SE Patient reports stable mood and denies depressed or  irritable moods.  Patient denies any recent difficulty with anxiety.  Patient denies difficulty with sleep initiation or maintenance. Denies appetite disturbance.  Patient reports that energy and motivation have been good.  Patient denies any difficulty with concentration.  Patient denies any suicidal ideation. Concern over cat in vet.   Past Psychiatric Medication Trials:  Sertraline  200 (on Zoloft  20 years) fluoxetine  no response in early 20's, retry 2023 NR after 6 weeks and anxiety unmanageable   Wellbutrin  XL 300,  buspirone  30 twice daily,  clonazepam, lorazepam ,  Adderall XR anxiety;   Concerta  54 mg aM  Christy Adams seeing Christy Adams;  markedly better with Concerta   Review of Systems:  Review of Systems  Cardiovascular:  Negative for chest pain and palpitations.  Neurological:  Negative for tremors.  Psychiatric/Behavioral:  Positive for decreased concentration. Negative for agitation, behavioral problems, confusion, dysphoric mood, hallucinations, self-injury, sleep disturbance and suicidal ideas. The patient is not nervous/anxious and is not hyperactive.     Medications: I have reviewed the patient's current medications.  Current Outpatient Medications  Medication Sig Dispense Refill   methylphenidate  (CONCERTA ) 54 MG PO CR tablet Take 1 tablet (54 mg total) by mouth daily. 30 tablet 0   montelukast (SINGULAIR) 10 MG tablet TK 1 T PO QHS     prednisoLONE acetate (PRED FORTE) 1 % ophthalmic suspension Place 1 drop into the left eye 4 (four) times daily.     progesterone (PROMETRIUM) 200 MG capsule Take 200 mg by mouth daily.     sertraline  (ZOLOFT ) 100 MG tablet TAKE 1.5 TABLETS (150 MG TOTAL) BY MOUTH DAILY 135 tablet 3   busPIRone  (BUSPAR ) 30 MG tablet Take 1 tablet (30 mg total) by mouth at bedtime. 90 tablet 1   methylphenidate  (CONCERTA ) 54 MG PO CR tablet Take 1 tablet (54 mg total) by mouth daily. 90 tablet 0   No current facility-administered medications for this  visit.    Medication Side Effects: None  Allergies: No Known Allergies  Past Medical History:  Diagnosis Date   Abnormal Pap smear of cervix    Allergy    Anemia    Anxiety    Asthma    allergic to dust - only uses inhaler prn - rarely   Depression    Headache    otc med prn - rare   HPV in female    Panic attacks    PONV (postoperative nausea and vomiting)    Sleep apnea    no CPAP   SVD (spontaneous vaginal delivery)    x 3    Family History  Problem Relation  Age of Onset   Heart disease Mother    Depression Mother    Hypertension Mother    Thyroid disease Mother    Breast cancer Mother 39   Colon polyps Mother    Heart attack Father 47   Psoriasis Sister    Skin cancer Maternal Aunt    Heart failure Maternal Grandmother    Diabetes Maternal Grandfather    Heart attack Maternal Grandfather     Social History   Socioeconomic History   Marital status: Divorced    Spouse name: Not on file   Number of children: Not on file   Years of education: Not on file   Highest education level: Not on file  Occupational History   Not on file  Tobacco Use   Smoking status: Never   Smokeless tobacco: Never  Vaping Use   Vaping status: Never Used  Substance and Sexual Activity   Alcohol use: Yes    Comment: wine at night 2-3 days per week.    Drug use: No   Sexual activity: Not Currently    Partners: Male    Comment: ablation and BTL  Other Topics Concern   Not on file  Social History Narrative   Not on file   Social Drivers of Health   Financial Resource Strain: Not on file  Food Insecurity: Not on file  Transportation Needs: Not on file  Physical Activity: Not on file  Stress: Not on file  Social Connections: Not on file  Intimate Partner Violence: Not on file    Past Medical History, Surgical history, Social history, and Family history were reviewed and updated as appropriate.   Please see review of systems for further details on the patient's  review from today.   Objective:   Physical Exam:  There were no vitals taken for this visit.  Physical Exam Constitutional:      General: She is not in acute distress.  Musculoskeletal:        General: No deformity.   Neurological:     Mental Status: She is alert and oriented to person, place, and time.     Cranial Nerves: No dysarthria.     Coordination: Coordination normal.   Psychiatric:        Attention and Perception: Attention and perception normal. She does not perceive auditory or visual hallucinations.        Mood and Affect: Mood normal. Mood is not anxious or depressed. Affect is not labile or inappropriate.        Speech: Speech normal.        Behavior: Behavior normal. Behavior is cooperative.        Thought Content: Thought content normal. Thought content is not delusional. Thought content does not include homicidal or suicidal ideation. Thought content does not include suicidal plan.        Cognition and Memory: Cognition and memory normal.        Judgment: Judgment normal.     Comments: Anxiety back under control on sertraline     Lab Review:     Component Value Date/Time   NA 139 05/08/2022 1330   K 4.1 05/08/2022 1330   CL 104 05/08/2022 1330   CO2 28 05/08/2022 1330   GLUCOSE 93 05/08/2022 1330   BUN 17 05/08/2022 1330   CREATININE 1.12 (H) 05/08/2022 1330   CREATININE 0.83 03/14/2017 1042   CALCIUM 9.3 05/08/2022 1330   PROT 6.9 03/14/2017 1042   AST 21 03/14/2017 1042   ALT  28 03/14/2017 1042   BILITOT 0.3 03/14/2017 1042   GFRNONAA >60 05/08/2022 1330       Component Value Date/Time   WBC 7.2 03/14/2017 1042   RBC 5.18 (H) 03/14/2017 1042   HGB 13.1 03/14/2017 1042   HCT 40.9 03/14/2017 1042   PLT 280 03/14/2017 1042   MCV 79.0 (L) 03/14/2017 1042   MCH 25.3 (L) 03/14/2017 1042   MCHC 32.0 03/14/2017 1042   RDW 13.3 03/14/2017 1042   LYMPHSABS 1,865 03/14/2017 1042   EOSABS 367 03/14/2017 1042   BASOSABS 86 03/14/2017 1042    No  results found for: POCLITH, LITHIUM   No results found for: PHENYTOIN, PHENOBARB, VALPROATE, CBMZ   .res Asse2ssment: Plan:    Attention deficit hyperactivity disorder (ADHD), combined type - Plan: methylphenidate  (CONCERTA ) 54 MG PO CR tablet  PTSD (post-traumatic stress disorder) - Plan: busPIRone  (BUSPAR ) 30 MG tablet  Panic disorder with agoraphobia  Recurrent major depression in full remission (HCC)  History of ADD  Good response to meds.  We discussed at length how sertraline  can sometimes have somewhat of a numbing effect.  However she has not typically experience that effect.  Discussed that this is dose related.  She reports each she has been stable on a lower dose of sertraline  150 mg but still can't cry.  Has not been having recent panic or anxiety or depressive symptoms.  Discussed the risk of relapse with reduction or change in sertraline .    Anxiety and depression are under good control.  Buspirone  in particular helps anxiety.  She notices more when she tends to run out.  Worse with switch to fluoxetine   Continue sertraline  150 mg daily Continue buspirone  30 mg twice daily for PTSD and augmentation for depression.  Could consider stopping if Concerta  helps her.  DT hx ADD and Adams;s dramatic benefit Concerta  and pt's ADD sx affecting driving it is reasonable to RX Concerta  to further improve function and it's been effective Continue Concerta  54 AM  Discussed potential benefits, risks, and side effects of stimulants with patient to include increased heart rate, palpitations, insomnia, increased anxiety, increased irritability, or decreased appetite.  Instructed patient to contact office if experiencing any significant tolerability issues.  Follow-up 4-6 mos  Christy Beat, MD, DFAPA  Please see After Visit Summary for patient specific instructions.  No future appointments.   No orders of the defined types were placed in this encounter.      -------------------------------

## 2024-01-16 ENCOUNTER — Telehealth: Admitting: Psychiatry

## 2024-01-16 ENCOUNTER — Encounter: Payer: Self-pay | Admitting: Psychiatry

## 2024-01-16 DIAGNOSIS — F3342 Major depressive disorder, recurrent, in full remission: Secondary | ICD-10-CM

## 2024-01-16 DIAGNOSIS — F4310 Post-traumatic stress disorder, unspecified: Secondary | ICD-10-CM | POA: Diagnosis not present

## 2024-01-16 DIAGNOSIS — F902 Attention-deficit hyperactivity disorder, combined type: Secondary | ICD-10-CM

## 2024-01-16 DIAGNOSIS — F4001 Agoraphobia with panic disorder: Secondary | ICD-10-CM

## 2024-01-16 MED ORDER — METHYLPHENIDATE HCL ER (OSM) 54 MG PO TBCR: 54.0000 mg | EXTENDED_RELEASE_TABLET | Freq: Every day | ORAL | 0 refills | Status: DC

## 2024-01-16 MED ORDER — LORAZEPAM 0.5 MG PO TABS: 0.5000 mg | ORAL_TABLET | Freq: Three times a day (TID) | ORAL | 0 refills | Status: AC | PRN

## 2024-01-16 MED ORDER — SERTRALINE HCL 100 MG PO TABS: 150.0000 mg | ORAL_TABLET | Freq: Every day | ORAL | 3 refills | Status: AC

## 2024-01-16 NOTE — Progress Notes (Signed)
 Christy Adams [id number] 14-Jan-1971 53 y.o.   Video Visit via My Chart  I connected with pt by My Chart video and verified that I am speaking with the correct person using two identifiers.   I discussed the limitations, risks, security and privacy concerns of performing an evaluation and management service by My Chart  and the availability of in person appointments. I also discussed with the patient that there may be a patient responsible charge related to this service. The patient expressed understanding and agreed to proceed.  I discussed the assessment and treatment plan with the patient. The patient was provided an opportunity to ask questions and all were answered. The patient agreed with the plan and demonstrated an understanding of the instructions.   The patient was advised to call back or seek an in-person evaluation if the symptoms worsen or if the condition fails to improve as anticipated.  I provided 30 minutes of video time during this encounter.  The patient was located at home and the provider was located [place]. Session from [id number]  Subjective:   Patient ID:  Christy Adams is a 53 y.o. (DOB 06-Sep-1970) female.  Chief Complaint:  Chief Complaint  Patient presents with   Follow-up   Medication Problem    HPI Christy Adams presents to the office today for follow-up of anxiety and depression.  visit August 20, 2018 & 02/2019.  No meds were changed.  09-09-2020 appt noted:  Mother died 2019/08/24 from massive stroke.  Hemorrhagic. Can't cry very easily.  Wonders if sertraline  interferes.  Wonders if it interferes with grief and she'll be having more problems later.  Doesn't feel like she's holding emotions back.   Has not felt flat before and has felt good emotionally.  Anxiety managed.   Good job supportive. Son to college in fall. Sisters and mother also on sertraline  and Wellbutrin . pLAN: OK trial reduction in sertraline  to 150 mg to see if grief tears are  easier to come and history of stability in past on 150 mg daily. Continue buspirone  30 mg twice daily for PTSD and augmentation for depression Continue Wellbutrin  XL 300 mg a.m. for depression and a history of ADD symptoms  10/20/20 TC:  wanted med change.  Increased sertraline  back to 200 mg daily.  07/12/2021 appt noted: Has been trying to stick with sertraline  150 mg daily.  Occ misses a day. Has been runnin gout of buspar  and didn't feel good. On way hand function well.  Hard year bc losing mother.   Still don't cry easily on sertraline  and doesn't like it. Restarting therapy. Overall feels ok with meds. No sig anxiety but a lot of emotional things and I feel detached. Not hlpeless.  Bluntness can be surprising. Sister health issues.  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 Plan: BC blunted with sertraline :  Reduce sertraline  to 1 tablet daily for 5 days, then start fluoxetine  1 daily and reduce sertraline  to 1/2 tablet for 1 week, then stop sertraline  and continue 1 fluoxetine  daily Continue buspirone  30 mg twice daily for PTSD and augmentation for depression Continue Wellbutrin  XL 300 mg a.m. for depression and a history of ADD symptoms  09/07/2021 phone call complaining that anxiety is worse on fluoxetine  and she will go back to sertraline . MD response:We switched from sertraline  2 months ago.  Her anxiety is worse on the fluoxetine .  This is a known risk and I agree with her that switching back to sertraline  makes the most sense.  However reduce fluoxetine  to 20 mg daily or 40 mg every other day and start sertraline  1/2 tablet of the 100 mg tablet size for 5 days, then continue fluoxetine  20 mg daily and increase sertraline  to 1 daily for 5 days, then stop fluoxetine  and increase sertraline  to 1-1/2 daily.  Please send in a prescription for sertraline  100 mg tablets 1-1/2 daily #45 and 1 refill  10/11/21 appt noted: Switched back to sertraline  and 90% better.  More willing to put up with the  flat SE from sertraline .  Anxiety unmanageable with fluoxetine .  On sertraline  150 mg daily.  Astonishing how much better I am.  Felt terrible on fluoxetine . Patient reports stable mood and denies depressed or irritable moods.  Patient denies any recent difficulty with anxiety.  Patient denies difficulty with sleep initiation or maintenance. Denies appetite disturbance.  Patient reports that energy and motivation have been good.  Patient denies any difficulty with concentration.  Patient denies any suicidal ideation. Plan: Worse with switch to fluoxetine  Continue sertraline  150 mg daily Continue buspirone  30 mg twice daily for PTSD and augmentation for depression Continue Wellbutrin  XL 300 mg a.m. for depression and a history of ADD symptoms  04/18/22 appt noted: Things are good.  More like herself with a range of emotions.  Ablet to weep. Had colon cancer on first screening colonoscopy in May.  Stage 1 resection. And no further tx needed.  At [facility] and in study.  Did CBT for pain management and it was great.   It also helped her anxiety and included PMR and other techniques.  Gave her more control. No more pain and cancer free. In [place] support group. Moving parents. No SE Patient reports stable mood and denies depressed or irritable moods.  Patient denies any recent difficulty with anxiety.  Patient denies difficulty with sleep initiation or maintenance. Denies appetite disturbance.  Patient reports that energy and motivation have been good.  Patient denies any difficulty with concentration.  Patient denies any suicidal ideation.  08/29/23 appt noted: Med: Wellbutrin  XL 300 AM, buspirone  30 HS, sertraline  150 mg daily. Piper ADHD and mind quieted quickly after starting Concerta .  Markedly better.  53 yo seeing Dr. Conny.  Her anxiety is much better too on the Concerta .  Makes her wonder about herself for ADD.  Wishes she could have dx D earlier.   Nothing else worked for [employer]. Pt going  through perimenopause, started 200 mg progesterone bc excessive crying and it helped.   Still some perimenopausal sx.   In counseling for ptsd like sx dealing with Piper's sx. Functions fine.   Not much residual anxiety.  Rarely feel anxious.  Not having the thoughts piper did.   Not sad significantly. Pt notes some tasks super focused and other tasks not.  Will finish sentences of other s which is annoying. Terrible distracted driver and others notice.  Distracted by everything driving.  It's not hard enough of activity to keep her focus.  Trouble remembering calendar.  2 cups coffee in the AM Plan: Continue sertraline  150 mg daily Continue buspirone  30 mg twice daily for PTSD and augmentation for depression DT hx ADD and D;s dramatic benefit Concerta  and pt's ADD sx affecting driving it is reasonable to try Concerta  to further improve function Stop Wellbutrin   Start Concerta  36 AM  09/26/23 appt noted: Med; buspirone  30 HS, sertraline  150 mg daily, Concerta  36 mg AM No anxiety with Concerta .  But not much benefit vs Wellbutrin . Started HRT.  Helped tearfulness which was not connected with sadness. No SE In past had severe dep and values sertraline .  Patient reports stable mood and denies depressed or irritable moods.  Mild anxiety.   Some sleep issues kind of light sleep.    Denies appetite disturbance.  Patient reports that energy and motivation have been good.  Patient denies any difficulty with concentration.  Patient denies any suicidal ideation. Questions re: D's psych med.   Plan: DT hx ADD and D;s dramatic benefit Concerta  and pt's ADD sx affecting driving it is reasonable to try Concerta  to further improve function Increase Concerta  54 AM  12/06/23 appt noted:  Med; buspirone  30 HS, sertraline  150 mg daily, Concerta  54 mg AM Great with increase Concerta .  Very pleased with benefit and doesn't want further change. No SE Patient reports stable mood and denies depressed or irritable  moods.  Patient denies any recent difficulty with anxiety.  Patient denies difficulty with sleep initiation or maintenance. Denies appetite disturbance.  Patient reports that energy and motivation have been good.  Patient denies any difficulty with concentration.  Patient denies any suicidal ideation. Concern over cat in vet.  01/16/24 appt noted:  Med; buspirone  30 HS, sertraline  150 mg daily, Concerta  54 mg AM Zepbound was magical but considering compounding and 2 shots and felt terrible.  Talked with doctor and said compounding might interfere with psych meds.  Wanted to reach out.   Felt better mood and mentally on Zepbound.   Less impulsive, compulsive.  Clearer thought process.   But on compounded despite same dose felt like a crazy person.  Had death thoughts but knew they were intrusive and would not act on them.   It was terrible.  Just now feeling normal today.  Last shot Sunday a week ago.   Piper is life changed and mind quieter but much better handling things.  Can verbalize if gets stressed.    Past Psychiatric Medication Trials:  Sertraline  200 (on Zoloft  20 years) fluoxetine  no response in early 20's, retry 2023 NR after 6 weeks and anxiety unmanageable   Wellbutrin  XL 300,  buspirone  30 twice daily,  clonazepam, lorazepam ,  Adderall XR anxiety;   Concerta  54 mg aM  Piper D seeing Selinda Lauth;  markedly better with Concerta   Review of Systems:  Review of Systems  Cardiovascular:  Negative for chest pain and palpitations.  Neurological:  Negative for tremors.  Psychiatric/Behavioral:  Positive for decreased concentration. Negative for agitation, behavioral problems, confusion, dysphoric mood, hallucinations, self-injury, sleep disturbance and suicidal ideas. The patient is not nervous/anxious and is not hyperactive.     Medications: I have reviewed the patient's current medications.  Current Outpatient Medications  Medication Sig Dispense Refill   busPIRone  (BUSPAR )  30 MG tablet Take 1 tablet (30 mg total) by mouth at bedtime. 90 tablet 1   LORazepam  (ATIVAN ) 0.5 MG tablet Take 1-2 tablets (0.5-1 mg total) by mouth every 8 (eight) hours as needed for anxiety. 30 tablet 0   lubiprostone (AMITIZA) 8 MCG capsule TAKE 1 CAPSULE BY MOUTH TWICE A DAY AS DIRECTED     montelukast (SINGULAIR) 10 MG tablet TK 1 T PO QHS     prednisoLONE acetate (PRED FORTE) 1 % ophthalmic suspension Place 1 drop into the left eye 4 (four) times daily.     progesterone (PROMETRIUM) 200 MG capsule Take 200 mg by mouth daily.     methylphenidate  (CONCERTA ) 54 MG PO CR tablet Take 1 tablet (54 mg total) by  mouth daily. 90 tablet 0   [START ON 02/13/2024] methylphenidate  (CONCERTA ) 54 MG PO CR tablet Take 1 tablet (54 mg total) by mouth daily. 30 tablet 0   sertraline  (ZOLOFT ) 100 MG tablet Take 1.5 tablets (150 mg total) by mouth daily. 135 tablet 3   No current facility-administered medications for this visit.    Medication Side Effects: None  Allergies: No Known Allergies  Past Medical History:  Diagnosis Date   Abnormal Pap smear of cervix    Allergy    Anemia    Anxiety    Asthma    allergic to dust - only uses inhaler prn - rarely   Depression    Headache    otc med prn - rare   HPV in female    Panic attacks    PONV (postoperative nausea and vomiting)    Sleep apnea    no CPAP   SVD (spontaneous vaginal delivery)    x 3    Family History  Problem Relation Age of Onset   Heart disease Mother    Depression Mother    Hypertension Mother    Thyroid disease Mother    Breast cancer Mother 59   Colon polyps Mother    Heart attack Father 80   Psoriasis Sister    Skin cancer Maternal Aunt    Heart failure Maternal Grandmother    Diabetes Maternal Grandfather    Heart attack Maternal Grandfather     Social History   Socioeconomic History   Marital status: Divorced    Spouse name: Not on file   Number of children: Not on file   Years of education: Not on  file   Highest education level: Not on file  Occupational History   Not on file  Tobacco Use   Smoking status: Never   Smokeless tobacco: Never  Vaping Use   Vaping status: Never Used  Substance and Sexual Activity   Alcohol use: Yes    Comment: wine at night 2-3 days per week.    Drug use: No   Sexual activity: Not Currently    Partners: Male    Comment: ablation and BTL  Other Topics Concern   Not on file  Social History Narrative   Not on file   Social Drivers of Health   Financial Resource Strain: Not on file  Food Insecurity: Not on file  Transportation Needs: Not on file  Physical Activity: Not on file  Stress: Not on file  Social Connections: Not on file  Intimate Partner Violence: Not on file    Past Medical History, Surgical history, Social history, and Family history were reviewed and updated as appropriate.   Please see review of systems for further details on the patient's review from today.   Objective:   Physical Exam:  There were no vitals taken for this visit.  Physical Exam Constitutional:      General: She is not in acute distress. Musculoskeletal:        General: No deformity.  Neurological:     Mental Status: She is alert and oriented to person, place, and time.     Cranial Nerves: No dysarthria.     Coordination: Coordination normal.  Psychiatric:        Attention and Perception: Attention and perception normal. She does not perceive auditory or visual hallucinations.        Mood and Affect: Mood normal. Mood is not anxious or depressed. Affect is not labile or inappropriate.  Speech: Speech normal.        Behavior: Behavior normal. Behavior is cooperative.        Thought Content: Thought content normal. Thought content is not delusional. Thought content does not include homicidal or suicidal ideation. Thought content does not include suicidal plan.        Cognition and Memory: Cognition and memory normal.        Judgment: Judgment  normal.     Comments: Anxiety back under control on sertraline  Except for brief problem with compounded tirzepetide.  Better again.    Lab Review:     Component Value Date/Time   NA 139 05/08/2022 1330   K 4.1 05/08/2022 1330   CL 104 05/08/2022 1330   CO2 28 05/08/2022 1330   GLUCOSE 93 05/08/2022 1330   BUN 17 05/08/2022 1330   CREATININE 1.12 (H) 05/08/2022 1330   CREATININE 0.83 03/14/2017 1042   CALCIUM 9.3 05/08/2022 1330   PROT 6.9 03/14/2017 1042   AST 21 03/14/2017 1042   ALT 28 03/14/2017 1042   BILITOT 0.3 03/14/2017 1042   GFRNONAA >60 05/08/2022 1330       Component Value Date/Time   WBC 7.2 03/14/2017 1042   RBC 5.18 (H) 03/14/2017 1042   HGB 13.1 03/14/2017 1042   HCT 40.9 03/14/2017 1042   PLT 280 03/14/2017 1042   MCV 79.0 (L) 03/14/2017 1042   MCH 25.3 (L) 03/14/2017 1042   MCHC 32.0 03/14/2017 1042   RDW 13.3 03/14/2017 1042   LYMPHSABS 1,865 03/14/2017 1042   EOSABS 367 03/14/2017 1042   BASOSABS 86 03/14/2017 1042    No results found for: POCLITH, LITHIUM   No results found for: PHENYTOIN, PHENOBARB, VALPROATE, CBMZ   .res Asse2ssment: Plan:    Attention deficit hyperactivity disorder (ADHD), combined type - Plan: methylphenidate  (CONCERTA ) 54 MG PO CR tablet, methylphenidate  (CONCERTA ) 54 MG PO CR tablet  PTSD (post-traumatic stress disorder) - Plan: LORazepam  (ATIVAN ) 0.5 MG tablet, sertraline  (ZOLOFT ) 100 MG tablet  Panic disorder with agoraphobia - Plan: LORazepam  (ATIVAN ) 0.5 MG tablet, sertraline  (ZOLOFT ) 100 MG tablet  Recurrent major depression in full remission (HCC)  History of ADD  Good response to meds.   She reports each she has been stable on a lower dose of sertraline  150 mg but still can't cry.  Has not been having recent panic or anxiety or depressive symptoms.  Discussed the risk of relapse with reduction or change in sertraline .    Anxiety and depression are under good control.  Buspirone  in particular  helps anxiety.  She notices more when she tends to run out.  However she had a very negative emotional response with very dark death thoughts without suicidal ideation when she took compounded to her tirzepetide.  She did not have that problem with the branded Zepbound and in fact felt emotionally well while taking it.  But the cost was too high.  As far as I am aware there is no difference between the compounded version and the brand name version as long as the dosages are equal except the compounded version has B12 added.  We discussed this at length that she may try it without the B12.  She is aware and insightful about the effect that the compounded product had on her mood and that she can manage it if it recurs knowing it will be brief.  She will also discuss this with her prescriber for the compounded GLP-1 med  Worse with switch to fluoxetine   Continue sertraline  150 mg daily Continue buspirone  30 mg twice daily for PTSD and augmentation for depression.  Could consider stopping if Concerta  helps her.  DT hx ADD and D;s dramatic benefit Concerta  and pt's ADD sx affecting driving it is reasonable to RX Concerta  to further improve function and it's been effective Continue Concerta  54 AM  Discussed potential benefits, risks, and side effects of stimulants with patient to include increased heart rate, palpitations, insomnia, increased anxiety, increased irritability, or decreased appetite.  Instructed patient to contact office if experiencing any significant tolerability issues.  Disc compounded px with Terzepetide with B12.  Maybe sensitive to high B12 levels.  But she had very negative emotional rxn to that product.  She may consider trying one more week of it wihtout the B12.  If SE recur then she won't take anymore.    Follow-up 4-6 mos  Lorene Macintosh, MD, DFAPA  Please see After Visit Summary for patient specific instructions.  Future Appointments  Date Time Provider Department Center   05/28/2024  9:00 AM Cottle, Lorene KANDICE Raddle., MD [facility] None     No orders of the defined types were placed in this encounter.     -------------------------------

## 2024-05-28 ENCOUNTER — Ambulatory Visit: Admitting: Psychiatry

## 2024-05-28 ENCOUNTER — Encounter: Payer: Self-pay | Admitting: Psychiatry

## 2024-05-28 DIAGNOSIS — F3342 Major depressive disorder, recurrent, in full remission: Secondary | ICD-10-CM | POA: Diagnosis not present

## 2024-05-28 DIAGNOSIS — F4001 Agoraphobia with panic disorder: Secondary | ICD-10-CM | POA: Diagnosis not present

## 2024-05-28 DIAGNOSIS — F4310 Post-traumatic stress disorder, unspecified: Secondary | ICD-10-CM

## 2024-05-28 DIAGNOSIS — F902 Attention-deficit hyperactivity disorder, combined type: Secondary | ICD-10-CM

## 2024-05-28 MED ORDER — METHYLPHENIDATE HCL ER (OSM) 54 MG PO TBCR: 54.0000 mg | EXTENDED_RELEASE_TABLET | Freq: Every day | ORAL | 0 refills | Status: AC

## 2024-05-28 NOTE — Progress Notes (Signed)
 Christy Adams [id number] [birth year] 53 y.o.   Video Visit via My Chart  I connected with pt by My Chart video and verified that I am speaking with the correct person using two identifiers.   I discussed the limitations, risks, security and privacy concerns of performing an evaluation and management service by My Chart  and the availability of in person appointments. I also discussed with the patient that there may be a patient responsible charge related to this service. The patient expressed understanding and agreed to proceed.  I discussed the assessment and treatment plan with the patient. The patient was provided an opportunity to ask questions and all were answered. The patient agreed with the plan and demonstrated an understanding of the instructions.   The patient was advised to call back or seek an in-person evaluation if the symptoms worsen or if the condition fails to improve as anticipated.  I provided 30 minutes of video time during this encounter.  The patient was located at home and the provider was located [place]. Session from [id number]  Subjective:   Patient ID:  Christy Adams is a 53 y.o. (DOB [birth year]) female.  Chief Complaint:  Chief Complaint  Patient presents with   Follow-up    HPI Christy Adams presents to the office today for follow-up of anxiety and depression.  visit August 20, 2018 & 02/2019.  No meds were changed.  08-27-20 appt noted:  Mother died 2019-08-11 from massive stroke.  Hemorrhagic. Can't cry very easily.  Wonders if sertraline  interferes.  Wonders if it interferes with grief and she'll be having more problems later.  Doesn't feel like she's holding emotions back.   Has not felt flat before and has felt good emotionally.  Anxiety managed.   Good job supportive. Son to college in fall. Sisters and mother also on sertraline  and Wellbutrin . pLAN: OK trial reduction in sertraline  to 150 mg to see if grief tears are easier to come and history  of stability in past on 150 mg daily. Continue buspirone  30 mg twice daily for PTSD and augmentation for depression Continue Wellbutrin  XL 300 mg a.m. for depression and a history of ADD symptoms  10/20/20 TC:  wanted med change.  Increased sertraline  back to 200 mg daily.  07/12/2021 appt noted: Has been trying to stick with sertraline  150 mg daily.  Occ misses a day. Has been runnin gout of buspar  and didn't feel good. On way hand function well.  Hard year bc losing mother.   Still don't cry easily on sertraline  and doesn't like it. Restarting therapy. Overall feels ok with meds. No sig anxiety but a lot of emotional things and I feel detached. Not hlpeless.  Bluntness can be surprising. Sister health issues.  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 Plan: BC blunted with sertraline :  Reduce sertraline  to 1 tablet daily for 5 days, then start fluoxetine  1 daily and reduce sertraline  to 1/2 tablet for 1 week, then stop sertraline  and continue 1 fluoxetine  daily Continue buspirone  30 mg twice daily for PTSD and augmentation for depression Continue Wellbutrin  XL 300 mg a.m. for depression and a history of ADD symptoms  09/07/2021 phone call complaining that anxiety is worse on fluoxetine  and she will go back to sertraline . MD response:We switched from sertraline  2 months ago.  Her anxiety is worse on the fluoxetine .  This is a known risk and I agree with her that switching back to sertraline  makes the most sense.  However reduce fluoxetine  to  20 mg daily or 40 mg every other day and start sertraline  1/2 tablet of the 100 mg tablet size for 5 days, then continue fluoxetine  20 mg daily and increase sertraline  to 1 daily for 5 days, then stop fluoxetine  and increase sertraline  to 1-1/2 daily.  Please send in a prescription for sertraline  100 mg tablets 1-1/2 daily #45 and 1 refill  10/11/21 appt noted: Switched back to sertraline  and 90% better.  More willing to put up with the flat SE from sertraline .   Anxiety unmanageable with fluoxetine .  On sertraline  150 mg daily.  Astonishing how much better I am.  Felt terrible on fluoxetine . Patient reports stable mood and denies depressed or irritable moods.  Patient denies any recent difficulty with anxiety.  Patient denies difficulty with sleep initiation or maintenance. Denies appetite disturbance.  Patient reports that energy and motivation have been good.  Patient denies any difficulty with concentration.  Patient denies any suicidal ideation. Plan: Worse with switch to fluoxetine  Continue sertraline  150 mg daily Continue buspirone  30 mg twice daily for PTSD and augmentation for depression Continue Wellbutrin  XL 300 mg a.m. for depression and a history of ADD symptoms  04/18/22 appt noted: Things are good.  More like herself with a range of emotions.  Ablet to weep. Had colon cancer on first screening colonoscopy in May.  Stage 1 resection. And no further tx needed.  At [facility] and in study.  Did CBT for pain management and it was great.   It also helped her anxiety and included PMR and other techniques.  Gave her more control. No more pain and cancer free. In Christian support group. Moving parents. No SE Patient reports stable mood and denies depressed or irritable moods.  Patient denies any recent difficulty with anxiety.  Patient denies difficulty with sleep initiation or maintenance. Denies appetite disturbance.  Patient reports that energy and motivation have been good.  Patient denies any difficulty with concentration.  Patient denies any suicidal ideation.  08/29/23 appt noted: Med: Wellbutrin  XL 300 AM, buspirone  30 HS, sertraline  150 mg daily. Piper ADHD and mind quieted quickly after starting Concerta .  Markedly better.  53 yo seeing Dr. Conny.  Her anxiety is much better too on the Concerta .  Makes her wonder about herself for ADD.  Wishes she could have dx D earlier.   Nothing else worked for [employer]. Pt going through perimenopause, started  200 mg progesterone bc excessive crying and it helped.   Still some perimenopausal sx.   In counseling for ptsd like sx dealing with Piper's sx. Functions fine.   Not much residual anxiety.  Rarely feel anxious.  Not having the thoughts piper did.   Not sad significantly. Pt notes some tasks super focused and other tasks not.  Will finish sentences of other s which is annoying. Terrible distracted driver and others notice.  Distracted by everything driving.  It's not hard enough of activity to keep her focus.  Trouble remembering calendar.  2 cups coffee in the AM Plan: Continue sertraline  150 mg daily Continue buspirone  30 mg twice daily for PTSD and augmentation for depression DT hx ADD and D;s dramatic benefit Concerta  and pt's ADD sx affecting driving it is reasonable to try Concerta  to further improve function Stop Wellbutrin   Start Concerta  36 AM  09/26/23 appt noted: Med; buspirone  30 HS, sertraline  150 mg daily, Concerta  36 mg AM No anxiety with Concerta .  But not much benefit vs Wellbutrin . Started HRT.  Helped tearfulness which was  not connected with sadness. No SE In past had severe dep and values sertraline .  Patient reports stable mood and denies depressed or irritable moods.  Mild anxiety.   Some sleep issues kind of light sleep.    Denies appetite disturbance.  Patient reports that energy and motivation have been good.  Patient denies any difficulty with concentration.  Patient denies any suicidal ideation. Questions re: D's psych med.   Plan: DT hx ADD and D;s dramatic benefit Concerta  and pt's ADD sx affecting driving it is reasonable to try Concerta  to further improve function Increase Concerta  54 AM  12/06/23 appt noted:  Med; buspirone  30 HS, sertraline  150 mg daily, Concerta  54 mg AM Great with increase Concerta .  Very pleased with benefit and doesn't want further change. No SE Patient reports stable mood and denies depressed or irritable moods.  Patient denies any  recent difficulty with anxiety.  Patient denies difficulty with sleep initiation or maintenance. Denies appetite disturbance.  Patient reports that energy and motivation have been good.  Patient denies any difficulty with concentration.  Patient denies any suicidal ideation. Concern over cat in vet.  01/16/24 appt noted:  Med; buspirone  30 HS, sertraline  150 mg daily, Concerta  54 mg AM Zepbound was magical but considering compounding and 2 shots and felt terrible.  Talked with doctor and said compounding might interfere with psych meds.  Wanted to reach out.   Felt better mood and mentally on Zepbound.   Less impulsive, compulsive.  Clearer thought process.   But on compounded despite same dose felt like a crazy person.  Had death thoughts but knew they were intrusive and would not act on them.   It was terrible.  Just now feeling normal today.  Last shot Sunday a week ago.   Piper is life changed and mind quieter but much better handling things.  Can verbalize if gets stressed.  05/28/24 appt noted: Med; buspirone  30 HS, sertraline  150 mg daily, Concerta  54 mg AM Progesterone helped emotional swings.  No estrogen yet Zepbound helps.  55# Doing really well emotionally.  Kids doing well.  Piper doing well on Seroquel and stimulant . Feels well physically.   No SE. No new concerns.  Main hard thing with sister with degenerative spine dz.  Constant pain and won't get better.   Past Psychiatric Medication Trials:  Sertraline  200 (on Zoloft  20 years) fluoxetine  no response in early 20's, retry 2023 NR after 6 weeks and anxiety unmanageable   Wellbutrin  XL 300,  buspirone  30 twice daily,  clonazepam, lorazepam ,  Adderall XR anxiety;   Concerta  54 mg aM  Piper D seeing Selinda Lauth;  markedly better with Concerta   Review of Systems:  Review of Systems  Cardiovascular:  Negative for chest pain and palpitations.  Neurological:  Negative for tremors.  Psychiatric/Behavioral:  Positive for  decreased concentration. Negative for agitation, behavioral problems, confusion, dysphoric mood, hallucinations, self-injury, sleep disturbance and suicidal ideas. The patient is not nervous/anxious and is not hyperactive.     Medications: I have reviewed the patient's current medications.  Current Outpatient Medications  Medication Sig Dispense Refill   busPIRone  (BUSPAR ) 30 MG tablet Take 1 tablet (30 mg total) by mouth at bedtime. 90 tablet 1   LORazepam  (ATIVAN ) 0.5 MG tablet Take 1-2 tablets (0.5-1 mg total) by mouth every 8 (eight) hours as needed for anxiety. 30 tablet 0   lubiprostone (AMITIZA) 8 MCG capsule TAKE 1 CAPSULE BY MOUTH TWICE A DAY AS DIRECTED  methylphenidate  (CONCERTA ) 54 MG PO CR tablet Take 1 tablet (54 mg total) by mouth daily. 30 tablet 0   montelukast (SINGULAIR) 10 MG tablet TK 1 T PO QHS     prednisoLONE acetate (PRED FORTE) 1 % ophthalmic suspension Place 1 drop into the left eye 4 (four) times daily.     progesterone (PROMETRIUM) 200 MG capsule Take 200 mg by mouth daily.     sertraline  (ZOLOFT ) 100 MG tablet Take 1.5 tablets (150 mg total) by mouth daily. 135 tablet 3   methylphenidate  (CONCERTA ) 54 MG PO CR tablet Take 1 tablet (54 mg total) by mouth daily. 90 tablet 0   No current facility-administered medications for this visit.    Medication Side Effects: None  Allergies: No Known Allergies  Past Medical History:  Diagnosis Date   Abnormal Pap smear of cervix    Allergy    Anemia    Anxiety    Asthma    allergic to dust - only uses inhaler prn - rarely   Depression    Headache    otc med prn - rare   HPV in female    Panic attacks    PONV (postoperative nausea and vomiting)    Sleep apnea    no CPAP   SVD (spontaneous vaginal delivery)    x 3    Family History  Problem Relation Age of Onset   Heart disease Mother    Depression Mother    Hypertension Mother    Thyroid disease Mother    Breast cancer Mother 55   Colon polyps  Mother    Heart attack Father 68   Psoriasis Sister    Skin cancer Maternal Aunt    Heart failure Maternal Grandmother    Diabetes Maternal Grandfather    Heart attack Maternal Grandfather     Social History   Socioeconomic History   Marital status: Divorced    Spouse name: Not on file   Number of children: Not on file   Years of education: Not on file   Highest education level: Not on file  Occupational History   Not on file  Tobacco Use   Smoking status: Never   Smokeless tobacco: Never  Vaping Use   Vaping status: Never Used  Substance and Sexual Activity   Alcohol use: Yes    Comment: wine at night 2-3 days per week.    Drug use: No   Sexual activity: Not Currently    Partners: Male    Comment: ablation and BTL  Other Topics Concern   Not on file  Social History Narrative   Not on file   Social Drivers of Health   Financial Resource Strain: Not on file  Food Insecurity: Not on file  Transportation Needs: Not on file  Physical Activity: Not on file  Stress: Not on file  Social Connections: Not on file  Intimate Partner Violence: Not on file    Past Medical History, Surgical history, Social history, and Family history were reviewed and updated as appropriate.   Please see review of systems for further details on the patient's review from today.   Objective:   Physical Exam:  There were no vitals taken for this visit.  Physical Exam Constitutional:      General: She is not in acute distress. Musculoskeletal:        General: No deformity.  Neurological:     Mental Status: She is alert and oriented to person, place, and time.  Cranial Nerves: No dysarthria.     Coordination: Coordination normal.  Psychiatric:        Attention and Perception: Attention and perception normal. She does not perceive auditory or visual hallucinations.        Mood and Affect: Mood normal. Mood is not anxious or depressed. Affect is not labile or inappropriate.         Speech: Speech normal.        Behavior: Behavior normal. Behavior is cooperative.        Thought Content: Thought content normal. Thought content is not delusional. Thought content does not include homicidal or suicidal ideation. Thought content does not include suicidal plan.        Cognition and Memory: Cognition and memory normal.        Judgment: Judgment normal.     Comments: Anxiety back under control on sertraline  Better again.    Lab Review:     Component Value Date/Time   NA 139 05/08/2022 1330   K 4.1 05/08/2022 1330   CL 104 05/08/2022 1330   CO2 28 05/08/2022 1330   GLUCOSE 93 05/08/2022 1330   BUN 17 05/08/2022 1330   CREATININE 1.12 (H) 05/08/2022 1330   CREATININE 0.83 03/14/2017 1042   CALCIUM 9.3 05/08/2022 1330   PROT 6.9 03/14/2017 1042   AST 21 03/14/2017 1042   ALT 28 03/14/2017 1042   BILITOT 0.3 03/14/2017 1042   GFRNONAA >60 05/08/2022 1330       Component Value Date/Time   WBC 7.2 03/14/2017 1042   RBC 5.18 (H) 03/14/2017 1042   HGB 13.1 03/14/2017 1042   HCT 40.9 03/14/2017 1042   PLT 280 03/14/2017 1042   MCV 79.0 (L) 03/14/2017 1042   MCH 25.3 (L) 03/14/2017 1042   MCHC 32.0 03/14/2017 1042   RDW 13.3 03/14/2017 1042   LYMPHSABS 1,865 03/14/2017 1042   EOSABS 367 03/14/2017 1042   BASOSABS 86 03/14/2017 1042    No results found for: POCLITH, LITHIUM   No results found for: PHENYTOIN, PHENOBARB, VALPROATE, CBMZ   .res Asse2ssment: Plan:    Attention deficit hyperactivity disorder (ADHD), combined type - Plan: methylphenidate  (CONCERTA ) 54 MG PO CR tablet  Recurrent major depression in full remission  PTSD (post-traumatic stress disorder)  Panic disorder with agoraphobia  History of ADD  Good response to meds.   She reports each she has been stable on a lower dose of sertraline  150 mg but still can't cry.  Has not been having recent panic or anxiety or depressive symptoms.  Discussed the risk of relapse with reduction  or change in sertraline .    Anxiety and depression are under good control.  Buspirone  in particular helps anxiety.  She notices more when she tends to run out.  However she had a very negative emotional response with very dark death thoughts without suicidal ideation when she took compounded to her tirzepetide.  She did not have that problem with the branded Zepbound and in fact felt emotionally well while taking it.  But the cost was too high.  As far as I am aware there is no difference between the compounded version and the brand name version as long as the dosages are equal except the compounded version has B12 added.  We discussed this at length that she may try it without the B12.  She is aware and insightful about the effect that the compounded product had on her mood and that she can manage it if it recurs  knowing it will be brief.  She will also discuss this with her prescriber for the compounded GLP-1 med  Worse with switch to fluoxetine   Continue sertraline  150 mg daily Continue buspirone  30 mg twice daily for PTSD and augmentation for depression.  Could consider stopping if Concerta  helps her.  DT hx ADD and D;s dramatic benefit Concerta  and pt's ADD sx affecting driving it is reasonable to RX Concerta  to further improve function and it's been effective Continue Concerta  54 AM  No med changes  Discussed potential benefits, risks, and side effects of stimulants with patient to include increased heart rate, palpitations, insomnia, increased anxiety, increased irritability, or decreased appetite.  Instructed patient to contact office if experiencing any significant tolerability issues.  Disc compounded px with Terzepetide with B12.  Maybe sensitive to high B12 levels.  But she had very negative emotional rxn to that product.  She may consider trying one more week of it wihtout the B12.  If SE recur then she won't take anymore.    Follow-up 6 mos  Lorene Macintosh, MD, DFAPA  Please see  After Visit Summary for patient specific instructions.  No future appointments.    No orders of the defined types were placed in this encounter.     -------------------------------

## 2024-05-30 ENCOUNTER — Other Ambulatory Visit: Payer: Self-pay

## 2024-05-30 DIAGNOSIS — F902 Attention-deficit hyperactivity disorder, combined type: Secondary | ICD-10-CM

## 2024-05-30 MED ORDER — METHYLPHENIDATE HCL ER (OSM) 54 MG PO TBCR: 54.0000 mg | EXTENDED_RELEASE_TABLET | Freq: Every day | ORAL | 0 refills | Status: AC

## 2024-05-30 NOTE — Progress Notes (Signed)
 Resent to Dr. Geoffry

## 2024-11-27 ENCOUNTER — Ambulatory Visit (INDEPENDENT_AMBULATORY_CARE_PROVIDER_SITE_OTHER): Admitting: Psychiatry
